# Patient Record
Sex: Male | Born: 1995 | Hispanic: No | Smoking: Never smoker
Health system: Northeastern US, Academic
[De-identification: ages and names within clinical notes are randomized; demographics above are authoritative.]

## PROBLEM LIST (undated history)

## (undated) ENCOUNTER — Encounter

## (undated) HISTORY — PX: ACHILLES TENDON REPAIR: SUR1153

---

## 2016-03-20 ENCOUNTER — Emergency Department
Admission: EM | Admit: 2016-03-20 | Discharge: 2016-03-20 | Disposition: A | Discharge status: Home / Self Care | Payer: Commercial Managed Care - PPO | Attending: Emergency Medicine | Admitting: Emergency Medicine

## 2016-03-20 ENCOUNTER — Emergency Department: Payer: Commercial Managed Care - PPO

## 2016-03-20 DIAGNOSIS — Y9371 Activity, boxing: Secondary | ICD-10-CM | POA: Diagnosis not present

## 2016-03-20 DIAGNOSIS — Y998 Other external cause status: Secondary | ICD-10-CM | POA: Diagnosis not present

## 2016-03-20 DIAGNOSIS — S0081XA Abrasion of other part of head, initial encounter: Secondary | ICD-10-CM | POA: Insufficient documentation

## 2016-03-20 DIAGNOSIS — Y929 Unspecified place or not applicable: Secondary | ICD-10-CM | POA: Insufficient documentation

## 2016-03-20 DIAGNOSIS — S022XXA Fracture of nasal bones, initial encounter for closed fracture: Secondary | ICD-10-CM | POA: Insufficient documentation

## 2016-03-20 DIAGNOSIS — S0083XA Contusion of other part of head, initial encounter: Secondary | ICD-10-CM

## 2016-03-20 DIAGNOSIS — S0990XA Unspecified injury of head, initial encounter: Secondary | ICD-10-CM | POA: Diagnosis present

## 2016-03-20 DIAGNOSIS — W51XXXA Accidental striking against or bumped into by another person, initial encounter: Secondary | ICD-10-CM | POA: Insufficient documentation

## 2016-03-20 MED ORDER — HYDROCODONE-ACETAMINOPHEN 5-325 MG PO TABS
1.0000 | ORAL_TABLET | Freq: Once | ORAL | Status: AC
Start: 2016-03-20 — End: 2016-03-20
  Administered 2016-03-20: 1 via ORAL
  Filled 2016-03-20: qty 1

## 2016-03-20 MED ORDER — HYDROCODONE-ACETAMINOPHEN 5-325 MG PO TABS: 1.0000 | ORAL_TABLET | Freq: Four times a day (QID) | ORAL | 0 refills | Status: DC | PRN

## 2016-03-20 MED ORDER — IBUPROFEN 800 MG PO TABS: 800.0000 mg | ORAL_TABLET | Freq: Once | ORAL | Status: DC

## 2016-03-20 MED ORDER — IBUPROFEN 800 MG PO TABS: 800.0000 mg | ORAL_TABLET | Freq: Three times a day (TID) | ORAL | 0 refills | Status: DC | PRN

## 2016-03-20 NOTE — ED Triage Notes (Signed)
Pt was boxing with a friend and was hit several times to face and head, nose is deviated and co feeling "foggy". Denies any LOC pt alert and appropriate.

## 2016-03-20 NOTE — ED Notes (Addendum)
Pt reports possible head injury after boxing, denies LOC.  Redness/swelling noted to nose, hematoma palpated on pt's scalp.  Pt reports some mild confusion, A&Ox4 upon assessment.  Pt reports concussion in past and current sx not as severe.  Pt reports taking 800mg  ibuprofen at home PTA

## 2016-03-20 NOTE — ED Provider Notes (Signed)
[facility] Emergency Department Provider Note   ____________________________________________   First MD Initiated Contact with Patient 03/20/16 [phone]     (approximate)  I have reviewed the triage vital signs and the nursing notes.   HISTORY  Chief Complaint Head Injury    HPI Kyle Benitez is a 21 y.o. male who presents to the ED from home with a chief complaint of facial pain. Patient was boxing with a friend, wearing thin boxing gloves, when he was struck in the head and nose. Denies loss of consciousness. Reports "fogginess" and nasal pain. Denies associated neck pain, vision changes, chest pain, shortness of breath, abdominal pain, nausea, vomiting, dizziness. Nothing makes his symptoms better. Palpation makes his symptoms worse.  Past medical history None  There are no active problems to display for this patient.   No past surgical history on file.  Prior to Admission medications   Medication Sig Start Date End Date Taking? Authorizing Provider  HYDROcodone-acetaminophen (NORCO) 5-325 MG tablet Take 1 tablet by mouth every 6 (six) hours as needed for moderate pain. 03/20/16   Irean Hong, MD  ibuprofen (ADVIL,MOTRIN) 800 MG tablet Take 1 tablet (800 mg total) by mouth every 8 (eight) hours as needed for moderate pain. 03/20/16   Irean Hong, MD    Allergies Patient has no known allergies.  No family history on file.  Social History Social History  Substance Use Topics  . Smoking status: Not on file  . Smokeless tobacco: Not on file  . Alcohol use Not on file    Review of Systems  Constitutional: No fever/chills. Eyes: No visual changes. ENT: Positive for head pain and nose pain. No sore throat. Cardiovascular: Denies chest pain. Respiratory: Denies shortness of breath. Gastrointestinal: No abdominal pain.  No nausea, no vomiting.  No diarrhea.  No constipation. Genitourinary: Negative for dysuria. Musculoskeletal: Negative for  back pain. Skin: Negative for rash. Neurological: Negative for headaches, focal weakness or numbness.  10-point ROS otherwise negative.  ____________________________________________   PHYSICAL EXAM:  VITAL SIGNS: ED Triage Vitals  Enc Vitals Group     BP 03/20/16 0209 (!) 143/64     Pulse Rate 03/20/16 0208 (!) 115     Resp 03/20/16 0208 18     Temp 03/20/16 0208 99.1 F (37.3 C)     Temp Source 03/20/16 0208 Oral     SpO2 03/20/16 0208 100 %     Weight 03/20/16 0208 145 lb (65.8 kg)     Height 03/20/16 0208 5\' 10"  (1.778 m)     Head Circumference --      Peak Flow --      Pain Score 03/20/16 0209 6     Pain Loc --      Pain Edu? --      Excl. in GC? --     Constitutional: Alert and oriented. Well appearing and in no acute distress. Eyes: Conjunctivae are normal. PERRL. EOMI. Head: Small abrasion to vertex of head. Nose: Dried blood in the left nares. Mild swelling, ecchymosis and deformity.. Mouth/Throat: Mucous membranes are moist.  Oropharynx non-erythematous. Neck: No stridor.  No cervical spine tenderness to palpation. Cardiovascular: Normal rate, regular rhythm. Grossly normal heart sounds.  Good peripheral circulation. Respiratory: Normal respiratory effort.  No retractions. Lungs CTAB. Gastrointestinal: Soft and nontender. No distention. No abdominal bruits. No CVA tenderness. Musculoskeletal: No lower extremity tenderness nor edema.  No joint effusions. Neurologic:  Alert and oriented 4. Ambulating with steady  gait. Normal speech and language. No gross focal neurologic deficits are appreciated. No gait instability. Skin:  Skin is warm, dry and intact. No rash noted. Psychiatric: Mood and affect are normal. Speech and behavior are normal.  ____________________________________________   LABS (all labs ordered are listed, but only abnormal results are displayed)  Labs Reviewed - No data to  display ____________________________________________  EKG  None ____________________________________________  RADIOLOGY  CT head and maxillofacial without contrast interpreted per Dr. Karie KirksBloomer:  CT HEAD: Normal.    CT MAXILLOFACIAL: Acute mildly displaced bilateral nasal bone  fractures.    ____________________________________________   PROCEDURES  Procedure(s) performed: None  Procedures  Critical Care performed: No  ____________________________________________   INITIAL IMPRESSION / ASSESSMENT AND PLAN / ED COURSE  Pertinent labs & imaging results that were available during my care of the patient were reviewed by me and considered in my medical decision making (see chart for details).  21 year old male with nondisplaced nasal bone fracture status post boxing with a friend. No evidence of intracranial hemorrhage on CT head. Will place on analgesics as needed and follow-up with ENT as needed. Strict return precautions given. Patient verbalizes understanding and agrees with plan of care.      ____________________________________________   FINAL CLINICAL IMPRESSION(S) / ED DIAGNOSES  Final diagnoses:  Contusion of face, initial encounter  Closed fracture of nasal bone, initial encounter  Minor head injury, initial encounter      NEW MEDICATIONS STARTED DURING THIS VISIT:  Discharge Medication List as of 03/20/2016  4:54 AM    START taking these medications   Details  HYDROcodone-acetaminophen (NORCO) 5-325 MG tablet Take 1 tablet by mouth every 6 (six) hours as needed for moderate pain., Starting Fri 03/20/2016, Print         Note:  This document was prepared using Dragon voice recognition software and may include unintentional dictation errors.    Irean HongJade J Bentlee Benningfield, MD 03/20/16 [phone]

## 2016-03-20 NOTE — Discharge Instructions (Signed)
1. Apply ice to affected area several times daily over the next 3 days. 2. You may take pain medicines as needed (Motrin/Norco #15). 3. Return to the ER for worsening symptoms, persistent vomiting, difficulty breathing, lethargy or other concerns.

## 2019-04-09 ENCOUNTER — Ambulatory Visit: Payer: Commercial Managed Care - PPO | Attending: Internal Medicine

## 2019-04-09 DIAGNOSIS — Z23 Encounter for immunization: Secondary | ICD-10-CM

## 2019-04-09 NOTE — Progress Notes (Signed)
   Covid-19 Vaccination Clinic  Name:  Kyle Benitez    MRN: [id number] DOB: 1995-08-01  04/09/2019  [name] was observed post Covid-19 immunization for 15 minutes without incident. He was provided with Vaccine Information Sheet and instruction to access the V-Safe system.   [name] was instructed to call 911 with any severe reactions post vaccine: Marland Kitchen Difficulty breathing  . Swelling of face and throat  . A fast heartbeat  . A bad rash all over body  . Dizziness and weakness   Immunizations Administered    Name Date Dose VIS Date Route   Pfizer COVID-19 Vaccine 04/09/2019  5:23 AM 0.3 mL 12/30/2018 Intramuscular   Manufacturer: [employer], [employer]   Lot: [id number]   NDC: [address]-0

## 2019-04-20 ENCOUNTER — Emergency Department
Admission: EM | Admit: 2019-04-20 | Discharge: 2019-04-20 | Disposition: A | Discharge status: Home / Self Care | Payer: Commercial Managed Care - PPO | Attending: Emergency Medicine | Admitting: Emergency Medicine

## 2019-04-20 ENCOUNTER — Encounter: Payer: Self-pay | Admitting: Emergency Medicine

## 2019-04-20 ENCOUNTER — Emergency Department: Payer: Commercial Managed Care - PPO

## 2019-04-20 ENCOUNTER — Other Ambulatory Visit: Payer: Self-pay

## 2019-04-20 DIAGNOSIS — F15988 Other stimulant use, unspecified with other stimulant-induced disorder: Secondary | ICD-10-CM | POA: Insufficient documentation

## 2019-04-20 DIAGNOSIS — W1839XA Other fall on same level, initial encounter: Secondary | ICD-10-CM | POA: Insufficient documentation

## 2019-04-20 DIAGNOSIS — Z8616 Personal history of COVID-19: Secondary | ICD-10-CM | POA: Diagnosis not present

## 2019-04-20 DIAGNOSIS — M79622 Pain in left upper arm: Secondary | ICD-10-CM | POA: Diagnosis not present

## 2019-04-20 DIAGNOSIS — Y9389 Activity, other specified: Secondary | ICD-10-CM | POA: Diagnosis not present

## 2019-04-20 DIAGNOSIS — M25512 Pain in left shoulder: Secondary | ICD-10-CM | POA: Diagnosis not present

## 2019-04-20 DIAGNOSIS — Y999 Unspecified external cause status: Secondary | ICD-10-CM | POA: Diagnosis not present

## 2019-04-20 DIAGNOSIS — Y92168 Other place in school dormitory as the place of occurrence of the external cause: Secondary | ICD-10-CM | POA: Insufficient documentation

## 2019-04-20 DIAGNOSIS — R569 Unspecified convulsions: Secondary | ICD-10-CM | POA: Diagnosis present

## 2019-04-20 LAB — CBC
HCT: 46.8 % (ref 39.0–52.0)
Hemoglobin: 16.1 g/dL (ref 13.0–17.0)
MCH: 32 pg (ref 26.0–34.0)
MCHC: 34.4 g/dL (ref 30.0–36.0)
MCV: 93 fL (ref 80.0–100.0)
Platelets: 398 10*3/uL (ref 150–400)
RBC: 5.03 MIL/uL (ref 4.22–5.81)
RDW: 12.1 % (ref 11.5–15.5)
WBC: 15.1 10*3/uL — ABNORMAL HIGH (ref 4.0–10.5)
nRBC: 0 % (ref 0.0–0.2)

## 2019-04-20 LAB — BASIC METABOLIC PANEL
Anion gap: 17 — ABNORMAL HIGH (ref 5–15)
BUN: 12 mg/dL (ref 6–20)
CO2: 20 mmol/L — ABNORMAL LOW (ref 22–32)
Calcium: 9.5 mg/dL (ref 8.9–10.3)
Chloride: 101 mmol/L (ref 98–111)
Creatinine, Ser: 1.12 mg/dL (ref 0.61–1.24)
GFR calc Af Amer: >60 mL/min (ref >60)
GFR calc non Af Amer: >60 mL/min (ref >60)
Glucose, Bld: 198 mg/dL — ABNORMAL HIGH (ref 70–99)
Potassium: 3.8 mmol/L (ref 3.5–5.1)
Sodium: 138 mmol/L (ref 135–145)

## 2019-04-20 MED ORDER — SODIUM CHLORIDE 0.9 % IV BOLUS
500.0000 mL | Freq: Once | INTRAVENOUS | Status: AC
  Administered 2019-04-20: 500 mL via INTRAVENOUS

## 2019-04-20 MED ORDER — KETOROLAC TROMETHAMINE 30 MG/ML IJ SOLN
30.0000 mg | Freq: Once | INTRAMUSCULAR | Status: AC
  Administered 2019-04-20: 30 mg via INTRAVENOUS
  Filled 2019-04-20: qty 1

## 2019-04-20 MED ORDER — HYDROCODONE-ACETAMINOPHEN 5-325 MG PO TABS
2.0000 | ORAL_TABLET | Freq: Once | ORAL | Status: AC
  Administered 2019-04-20: 20:00:00 2 via ORAL
  Filled 2019-04-20: qty 2

## 2019-04-20 MED ORDER — FENTANYL CITRATE (PF) 100 MCG/2ML IJ SOLN
50.0000 ug | Freq: Once | INTRAMUSCULAR | Status: AC
  Administered 2019-04-20: 19:00:00 50 ug via INTRAVENOUS
  Filled 2019-04-20: qty 2

## 2019-04-20 MED ORDER — HYDROCODONE-ACETAMINOPHEN 5-325 MG PO TABS: 1.0000 | ORAL_TABLET | Freq: Four times a day (QID) | ORAL | 0 refills | Status: DC | PRN

## 2019-04-20 MED ORDER — OXYCODONE-ACETAMINOPHEN 5-325 MG PO TABS: 1.0000 | ORAL_TABLET | ORAL | Status: DC | PRN

## 2019-04-20 NOTE — ED Notes (Signed)
Pt trx to CT.  

## 2019-04-20 NOTE — ED Notes (Signed)
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

## 2019-04-20 NOTE — Discharge Instructions (Addendum)
No driving until cleared by your doctor or neurologist.   Discontinue ADDERALL and herbal stimulants (these may have caused or contributed to your seizure today)

## 2019-04-20 NOTE — ED Notes (Signed)
ED Provider at bedside. 

## 2019-04-20 NOTE — ED Provider Notes (Signed)
[facility] Emergency Department Provider Note ____________________________________________   First MD Initiated Contact with Patient 04/20/19 1826     (approximate)  I have reviewed the triage vital signs and the nursing notes.  HISTORY  Chief Complaint Seizure  HPI Kyle Benitez is a 24 y.o. male here for evaluation after seizure  Patient reports he was studying in his dorm room.  He was walking down the hallway when he was witnessed to have a seizure by his roommate.  Reports he had a seizure once about 6 years ago but does not take any medication for it  He does take Adderall but last took it about 2 days ago which was prescribed to him.  He also took a supplement called Phenibut today which she reports is supposed to be a stimulant to help him study and he has not been sleeping much due to anxiety around school work  He denies heavy alcohol use.  Denies any biting his tongue or urinate himself.  Did not strike his head that he knows of.  He reports he feels fine except his left shoulder is very sore and painful.  This pain started immediately after the seizure     History reviewed. No pertinent past medical history.  There are no problems to display for this patient.   History reviewed. No pertinent surgical history.  Prior to Admission medications     Allergies Patient has no known allergies.  No family history on file.  Social History Social History   Tobacco Use  . Smoking status: Never Smoker  . Smokeless tobacco: Never Used  Substance Use Topics  . Alcohol use: Yes  . Drug use: Not on file    Review of Systems Constitutional: No fever/chills or recent illness.  He did however have Covid infection but reports that was a few months ago and got better Eyes: No visual changes. ENT: No sore throat. Cardiovascular: Denies chest pain. Respiratory: Denies shortness of breath. Gastrointestinal: No abdominal pain.     Genitourinary: Negative for dysuria. Musculoskeletal: Negative for back pain.  Left shoulder pain.  No pain in his right arm or legs hips back neck.  Does however report pretty significant pain over the front of his left shoulder Skin: Negative for rash. Neurological: Negative for headaches, areas of focal weakness or numbness.    ____________________________________________   PHYSICAL EXAM:  VITAL SIGNS: ED Triage Vitals  Enc Vitals Group     BP 04/20/19 1818 119/65     Pulse Rate 04/20/19 1817 80     Resp 04/20/19 1817 18     Temp 04/20/19 1817 98.6 F (37 C)     Temp Source 04/20/19 1817 Oral     SpO2 04/20/19 1818 96 %     Weight 04/20/19 1818 155 lb (70.3 kg)     Height 04/20/19 1818 5\' 10"  (1.778 m)     Head Circumference --      Peak Flow --      Pain Score --      Pain Loc --      Pain Edu? --      Excl. in GC? --     Constitutional: Alert and oriented. Well appearing and in no acute distress. Eyes: Conjunctivae are normal. Head: Atraumatic. Nose: No congestion/rhinnorhea. Mouth/Throat: Mucous membranes are moist. Neck: No stridor.  Cardiovascular: Normal rate, regular rhythm. Grossly normal heart sounds.  Good peripheral circulation. Respiratory: Normal respiratory effort.  No retractions. Lungs CTAB. Gastrointestinal: Soft and  nontender. No distention. Musculoskeletal: Moves all extremities well with exception of left shoulder which is painful.  He does have some slight edema overlying the left anterior shoulder joint.  Focal tenderness over the left shoulder and upper one third of the left humerus  Strong intact radial pulses bilateral.  Normal median radial and ulnar nerve examination left upper extremity.  Moves all other extremities well without difficulty distress or pain.  Neurologic:  Normal speech and language. No gross focal neurologic deficits are appreciated.  Reflexes are slightly brisk. Skin:  Skin is warm, dry and intact. No rash  noted. Psychiatric: Mood and affect are normal. Speech and behavior are normal.  ____________________________________________   LABS (all labs ordered are listed, but only abnormal results are displayed)  Labs Reviewed  CBC - Abnormal; Notable for the following components:      Result Value   WBC 15.1 (*)    All other components within normal limits  BASIC METABOLIC PANEL - Abnormal; Notable for the following components:   CO2 20 (*)    Glucose, Bld 198 (*)    Anion gap 17 (*)    All other components within normal limits   ____________________________________________  EKG  Reviewed inter by me at 2000 Heart rate 79 QRS 99 QTc 420 Normal sinus rhythm, slight sinus arrhythmia.  No prolongation of QT.  No abnormal T waves. ____________________________________________  RADIOLOGY  DG Ribs Unilateral W/Chest Left  Result Date: 04/20/2019 CLINICAL DATA:  Fall with rib pain EXAM: LEFT RIBS AND CHEST - 3+ VIEW COMPARISON:  None. FINDINGS: No fracture or other bone lesions are seen involving the ribs. There is no evidence of pneumothorax or pleural effusion. Both lungs are clear. Heart size and mediastinal contours are within normal limits. IMPRESSION: Negative. Electronically Signed   By: Donavan Foil M.D.   On: 04/20/2019 19:38   DG Forearm Left  Result Date: 04/20/2019 CLINICAL DATA:  Extremity pain EXAM: LEFT FOREARM - 2 VIEW COMPARISON:  None. FINDINGS: There is no evidence of fracture or other focal bone lesions. Soft tissues are unremarkable. IMPRESSION: Negative. Electronically Signed   By: Donavan Foil M.D.   On: 04/20/2019 19:33   CT Head Wo Contrast  Result Date: 04/20/2019 CLINICAL DATA:  [place] with subsequent seizure like activity. EXAM: CT HEAD WITHOUT CONTRAST TECHNIQUE: Contiguous axial images were obtained from the base of the skull through the vertex without intravenous contrast. COMPARISON:  head CT 03/20/2016 FINDINGS: Brain: The brain shows a normal appearance without  evidence of malformation, atrophy, old or acute small or large vessel infarction, mass lesion, hemorrhage, hydrocephalus or extra-axial collection. Vascular: No hyperdense vessel. No evidence of atherosclerotic calcification. Skull: Normal.  No traumatic finding.  No focal bone lesion. Sinuses/Orbits: Sinuses are clear. Orbits appear normal. Mastoids are clear. Other: None significant IMPRESSION: Normal head CT. Electronically Signed   By: Nelson Chimes M.D.   On: 04/20/2019 20:11   DG Humerus Left  Result Date: 04/20/2019 CLINICAL DATA:  [place], injury EXAM: LEFT HUMERUS - 2+ VIEW COMPARISON:  None. FINDINGS: Frontal and lateral views of the left humerus are obtained. No fractures. Alignment of the left shoulder and elbow is anatomic. Soft tissues are normal. IMPRESSION: 1. Unremarkable left humerus. Electronically Signed   By: Randa Ngo M.D.   On: 04/20/2019 18:59    Imaging studies reviewed including normal CT of the head.  Negative imaging of the left forearm humerus ribs ____________________________________________   PROCEDURES  Procedure(s) performed: None  Procedures  Critical Care performed: No  ____________________________________________   INITIAL IMPRESSION / ASSESSMENT AND PLAN / ED COURSE  Pertinent labs & imaging results that were available during my care of the patient were reviewed by me and considered in my medical decision making (see chart for details).     Clinical Course as of Apr 19 2036  Thu Apr 20, 2019  0093 He denies pain in the left hand forearm or elbow.  On examination no tenderness across the joints or bony portions of the left hand forearm or elbow, but does report moderate pain and fairly significant pain to palpation across the anterior left shoulder joint and proximal humerus without obvious deformity but some swelling   [MQ]  1951 Patient gave me permission to discuss his x-ray findings and concerns about his shoulder pain and my recommendation for  temporary sling and follow-up closely with orthopedics.  Discussed this with him and his parents at his permission, however he did not wish for me to discuss the use of Adderall or supplements that could have contributed to his seizure today with his parents that we did not.  I did discuss this with the patient privately however, and he is agreeable to discontinuing use of Adderall and stimulant supplements which could have contributed to his seizure today.  Parents report previously that he had a seizure about 6 years ago which they believe is attributable to Wellbutrin use   [MQ]  2016 Labs revealed minimally reduced CO2, also mild leukocytosis which is nonspecific.  Suspect these are likely secondary to his seizure.  He is awake alert fully oriented with normal vital signs without complaint other than left shoulder discomfort at this time.   [MQ]    Clinical Course User Index [MQ] Sharyn Creamer, MD   ----------------------------------------- 8:37 PM on 04/20/2019 -----------------------------------------  Vitals:   04/20/19 1817 04/20/19 1818  BP:  119/65  Pulse: 80 80  Resp: 18 18  Temp: 98.6 F (37 C) 98.6 F (37 C)  SpO2:  96%    Patient resting comfortably no distress.  Discussed with the patient and his parents plan of care.  I will prescribe the patient a narcotic pain medicine due to their condition which I anticipate will cause at least moderate pain short term. I discussed with the patient safe use of narcotic pain medicines, and that they are not to drive, work in dangerous areas, or ever take more than prescribed. We discussed that this is the type of medication that can be  overdosed on and the risks of this type of medicine. Patient is very agreeable to only use as prescribed and to never use more than prescribed.  He will discontinue use of Adderall and herbal stimulants.  Work to get plenty of rest.  Follow-up with neurology in as well as orthopedics.    ____________________________________________   FINAL CLINICAL IMPRESSION(S) / ED DIAGNOSES  Final diagnoses:  Substance-induced seizure (HCC)  Acute pain of left shoulder        Note:  This document was prepared using Dragon voice recognition software and may include unintentional dictation errors       Sharyn Creamer, MD 04/20/19 2038

## 2019-04-20 NOTE — ED Triage Notes (Addendum)
Brought in by friends    Friends states that he fell   Landed on left arm  Then developed sz like activity .Marland Kitchen hx of sz in past  Pt is pale and diaphoretic

## 2019-04-21 ENCOUNTER — Observation Stay: Payer: Commercial Managed Care - PPO

## 2019-04-21 ENCOUNTER — Other Ambulatory Visit: Payer: Self-pay

## 2019-04-21 ENCOUNTER — Emergency Department: Payer: Commercial Managed Care - PPO

## 2019-04-21 ENCOUNTER — Observation Stay
Admission: EM | Admit: 2019-04-21 | Discharge: 2019-04-22 | Disposition: A | Discharge status: Home / Self Care | Payer: Commercial Managed Care - PPO | Attending: Internal Medicine | Admitting: Internal Medicine

## 2019-04-21 DIAGNOSIS — M25512 Pain in left shoulder: Secondary | ICD-10-CM | POA: Insufficient documentation

## 2019-04-21 DIAGNOSIS — R569 Unspecified convulsions: Principal | ICD-10-CM

## 2019-04-21 DIAGNOSIS — D72829 Elevated white blood cell count, unspecified: Secondary | ICD-10-CM | POA: Diagnosis not present

## 2019-04-21 DIAGNOSIS — Z7901 Long term (current) use of anticoagulants: Secondary | ICD-10-CM | POA: Insufficient documentation

## 2019-04-21 DIAGNOSIS — F1390 Sedative, hypnotic, or anxiolytic use, unspecified, uncomplicated: Secondary | ICD-10-CM | POA: Diagnosis not present

## 2019-04-21 DIAGNOSIS — J341 Cyst and mucocele of nose and nasal sinus: Secondary | ICD-10-CM | POA: Insufficient documentation

## 2019-04-21 DIAGNOSIS — M25519 Pain in unspecified shoulder: Secondary | ICD-10-CM | POA: Diagnosis present

## 2019-04-21 DIAGNOSIS — G40909 Epilepsy, unspecified, not intractable, without status epilepticus: Secondary | ICD-10-CM

## 2019-04-21 DIAGNOSIS — R030 Elevated blood-pressure reading, without diagnosis of hypertension: Secondary | ICD-10-CM | POA: Diagnosis present

## 2019-04-21 DIAGNOSIS — Z20822 Contact with and (suspected) exposure to covid-19: Secondary | ICD-10-CM | POA: Insufficient documentation

## 2019-04-21 HISTORY — DX: Epilepsy, unspecified, not intractable, without status epilepticus: G40.909

## 2019-04-21 LAB — CBC
HCT: 45.7 % (ref 39.0–52.0)
Hemoglobin: 16.2 g/dL (ref 13.0–17.0)
MCH: 32.6 pg (ref 26.0–34.0)
MCHC: 35.4 g/dL (ref 30.0–36.0)
MCV: 92 fL (ref 80.0–100.0)
Platelets: 315 10*3/uL (ref 150–400)
RBC: 4.97 MIL/uL (ref 4.22–5.81)
RDW: 12.3 % (ref 11.5–15.5)
WBC: 21.2 10*3/uL — ABNORMAL HIGH (ref 4.0–10.5)
nRBC: 0 % (ref 0.0–0.2)

## 2019-04-21 LAB — URINALYSIS, COMPLETE (UACMP) WITH MICROSCOPIC
Bacteria, UA: NONE SEEN
Bilirubin Urine: NEGATIVE
Glucose, UA: NEGATIVE mg/dL
Ketones, ur: NEGATIVE mg/dL
Leukocytes,Ua: NEGATIVE
Nitrite: NEGATIVE
Protein, ur: NEGATIVE mg/dL
Specific Gravity, Urine: 1.005 (ref 1.005–1.030)
pH: 5 (ref 5.0–8.0)

## 2019-04-21 LAB — COMPREHENSIVE METABOLIC PANEL
ALT: 31 U/L (ref 0–44)
AST: 51 U/L — ABNORMAL HIGH (ref 15–41)
Albumin: 4.6 g/dL (ref 3.5–5.0)
Alkaline Phosphatase: 96 U/L (ref 38–126)
Anion gap: 11 (ref 5–15)
BUN: 10 mg/dL (ref 6–20)
CO2: 23 mmol/L (ref 22–32)
Calcium: 8.9 mg/dL (ref 8.9–10.3)
Chloride: 105 mmol/L (ref 98–111)
Creatinine, Ser: 1.12 mg/dL (ref 0.61–1.24)
GFR calc Af Amer: >60 mL/min (ref >60)
GFR calc non Af Amer: >60 mL/min (ref >60)
Glucose, Bld: 115 mg/dL — ABNORMAL HIGH (ref 70–99)
Potassium: 3.9 mmol/L (ref 3.5–5.1)
Sodium: 139 mmol/L (ref 135–145)
Total Bilirubin: 1.1 mg/dL (ref 0.3–1.2)
Total Protein: 7.6 g/dL (ref 6.5–8.1)

## 2019-04-21 LAB — URINE DRUG SCREEN, QUALITATIVE (ARMC ONLY)
Amphetamines, Ur Screen: NOT DETECTED
Barbiturates, Ur Screen: NOT DETECTED
Benzodiazepine, Ur Scrn: NOT DETECTED
Cannabinoid 50 Ng, Ur ~~LOC~~: POSITIVE — AB
Cocaine Metabolite,Ur ~~LOC~~: NOT DETECTED
MDMA (Ecstasy)Ur Screen: NOT DETECTED
Methadone Scn, Ur: NOT DETECTED
Opiate, Ur Screen: POSITIVE — AB
Phencyclidine (PCP) Ur S: NOT DETECTED
Tricyclic, Ur Screen: NOT DETECTED

## 2019-04-21 LAB — SARS CORONAVIRUS 2 (TAT 6-24 HRS): SARS Coronavirus 2: NEGATIVE

## 2019-04-21 LAB — HIV ANTIBODY (ROUTINE TESTING W REFLEX): HIV Screen 4th Generation wRfx: NONREACTIVE

## 2019-04-21 MED ORDER — LORAZEPAM 2 MG/ML IJ SOLN
1.0000 mg | Freq: Once | INTRAMUSCULAR | Status: AC
  Administered 2019-04-21: 1 mg via INTRAVENOUS
  Filled 2019-04-21: qty 1

## 2019-04-21 MED ORDER — IBUPROFEN 400 MG PO TABS: 600.0000 mg | ORAL_TABLET | Freq: Four times a day (QID) | ORAL | Status: DC | PRN

## 2019-04-21 MED ORDER — KETOROLAC TROMETHAMINE 30 MG/ML IJ SOLN
30.0000 mg | Freq: Once | INTRAMUSCULAR | Status: AC
  Administered 2019-04-21: 04:00:00 30 mg via INTRAVENOUS
  Filled 2019-04-21: qty 1

## 2019-04-21 MED ORDER — SODIUM CHLORIDE 0.9 % IV SOLN
75.0000 mL/h | INTRAVENOUS | Status: DC
  Administered 2019-04-21: 07:00:00 75 mL/h via INTRAVENOUS

## 2019-04-21 MED ORDER — ENOXAPARIN SODIUM 40 MG/0.4ML ~~LOC~~ SOLN
40.0000 mg | SUBCUTANEOUS | Status: DC
  Administered 2019-04-21: 40 mg via SUBCUTANEOUS
  Filled 2019-04-21: qty 0.4

## 2019-04-21 MED ORDER — SENNOSIDES-DOCUSATE SODIUM 8.6-50 MG PO TABS: 1.0000 | ORAL_TABLET | Freq: Every evening | ORAL | Status: DC | PRN

## 2019-04-21 MED ORDER — DIAZEPAM 5 MG/ML IJ SOLN
5.0000 mg | Freq: Once | INTRAMUSCULAR | Status: AC
  Administered 2019-04-21: 06:00:00 5 mg via INTRAVENOUS
  Filled 2019-04-21: qty 2

## 2019-04-21 MED ORDER — KETOROLAC TROMETHAMINE 30 MG/ML IJ SOLN
30.0000 mg | Freq: Four times a day (QID) | INTRAMUSCULAR | Status: AC | PRN
  Administered 2019-04-21 (×2): 30 mg via INTRAVENOUS
  Filled 2019-04-21 (×2): qty 1

## 2019-04-21 MED ORDER — ACETAMINOPHEN 650 MG RE SUPP: 650.0000 mg | RECTAL | Status: DC | PRN

## 2019-04-21 MED ORDER — LORAZEPAM 2 MG/ML IJ SOLN: 1.0000 mg | INTRAMUSCULAR | Status: DC | PRN

## 2019-04-21 MED ORDER — ACETAMINOPHEN 325 MG PO TABS: 650.0000 mg | ORAL_TABLET | ORAL | Status: DC | PRN

## 2019-04-21 MED ORDER — GADOBUTROL 1 MMOL/ML IV SOLN
7.0000 mL | Freq: Once | INTRAVENOUS | Status: AC | PRN
  Administered 2019-04-21: 7 mL via INTRAVENOUS
  Filled 2019-04-21: qty 7.5

## 2019-04-21 NOTE — Consult Note (Signed)
Reason for Consult:Seizures Requesting Physician: Para March  CC: Seizures  I have been asked by Dr. Para March to see this patient in consultation for seizures.  HPI: Kyle Benitez is an 24 y.o. 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   Patient was released from the ED and returned hours later with a recurrent generalized seizure.  Patient reports taking Xanax fairly regularly for the past 3-4 weeks.  Stopped taking for the past few days.  Also reports Adderall use and a herbal stimulant that he is using as well.      Past Medical History:  Diagnosis Date  . 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 ACHILLES TENDON REPAIR      No family history on file.  Social History:  reports that he has never smoked. He has never used smokeless tobacco. He reports current alcohol use. He reports current drug use. Drug: Marijuana.  Allergies  Allergen Reactions  . Bupropion     Other reaction(s): Seizures  . Latex Rash    Medications:  I have reviewed the patient's current medications. Prior to Admission: (Not in a hospital admission)  Scheduled: . enoxaparin (LOVENOX) injection  40 mg Subcutaneous Q24H    ROS: History obtained from the patient  General ROS: negative for - chills, fatigue, fever, night sweats, weight gain or weight loss Psychological ROS: anxiety Ophthalmic ROS: negative for - blurry vision, double vision, eye pain or loss of vision ENT ROS: negative for - epistaxis, nasal discharge, oral lesions, sore throat, tinnitus or vertigo Allergy and Immunology ROS: negative for - hives or itchy/watery eyes Hematological and Lymphatic ROS: negative for - bleeding problems, bruising or swollen lymph nodes Endocrine ROS: negative for - galactorrhea, hair pattern changes, polydipsia/polyuria or temperature intolerance Respiratory ROS:  negative for - cough, hemoptysis, shortness of breath or wheezing Cardiovascular ROS: negative for - chest pain, dyspnea on exertion, edema or irregular heartbeat Gastrointestinal ROS: negative for - abdominal pain, diarrhea, hematemesis, nausea/vomiting or stool incontinence Genito-Urinary ROS: negative for - dysuria, hematuria, incontinence or urinary frequency/urgency Musculoskeletal ROS: negative for - joint swelling or muscular weakness Neurological ROS: as noted in HPI, headache Dermatological ROS: negative for rash and skin lesion changes  Physical Examination: Blood pressure (!) 154/92, pulse 65, temperature 97.7 F (36.5 C), temperature source Oral, resp. rate (!) 22, height 5\' 10"  (1.778 m), weight 70.3 kg, SpO2 97 %.  HEENT-  Normocephalic, no lesions, without obvious abnormality.  Normal external eye and conjunctiva.  Normal TM's bilaterally.  Normal auditory canals and external ears. Normal external nose, mucus membranes and septum.  Normal pharynx. Cardiovascular- S1, S2 normal, pulses palpable throughout   Lungs- chest clear, no wheezing, rales, normal symmetric air entry Abdomen- soft, non-tender; bowel sounds normal; no masses,  no organomegaly Extremities- no edema Lymph-no adenopathy palpable Musculoskeletal-no joint tenderness, deformity or swelling Skin-warm and dry, no hyperpigmentation, vitiligo, or suspicious lesions  Neurological Examination   Mental Status: Alert, oriented, thought content appropriate.  Speech fluent without evidence of aphasia.  Able to follow 3 step commands without difficulty. Cranial Nerves: II: Discs flat bilaterally; Visual fields grossly normal, pupils equal, round, reactive to light and accommodation III,IV, VI: ptosis not present, extra-ocular motions intact bilaterally V,VII: smile symmetric, facial light touch sensation normal bilaterally VIII: hearing normal bilaterally IX,X: gag reflex present XI: bilateral shoulder shrug XII:  midline  tongue extension Motor: Right : Upper extremity   5/5    Left:     Upper extremity   5/5  Lower extremity   5/5     Lower extremity   5/5 Tone and bulk:normal tone throughout; no atrophy noted Sensory: Pinprick and light touch intact throughout, bilaterally Deep Tendon Reflexes: Symmetric throughout Plantars: Right: downgoing   Left: downgoing Cerebellar: Normal finger-to-nose and normal heel-to-shin testing bilaterally Gait: not tested due to safety concerns   Laboratory Studies:   Basic Metabolic Panel: Recent Labs  Lab 04/20/19 1834 04/21/19 0427  NA 138 139  K 3.8 3.9  CL 101 105  CO2 20* 23  GLUCOSE 198* 115*  BUN 12 10  CREATININE 1.12 1.12  CALCIUM 9.5 8.9    Liver Function Tests: Recent Labs  Lab 04/21/19 0427  AST 51*  ALT 31  ALKPHOS 96  BILITOT 1.1  PROT 7.6  ALBUMIN 4.6   No results for input(s): LIPASE, AMYLASE in the last 168 hours. No results for input(s): AMMONIA in the last 168 hours.  CBC: Recent Labs  Lab 04/20/19 1834 04/21/19 0427  WBC 15.1* 21.2*  HGB 16.1 16.2  HCT 46.8 45.7  MCV 93.0 92.0  PLT 398 315    Cardiac Enzymes: No results for input(s): CKTOTAL, CKMB, CKMBINDEX, TROPONINI in the last 168 hours.  BNP: Invalid input(s): POCBNP  CBG: No results for input(s): GLUCAP in the last 168 hours.  Microbiology: No results found for this or any previous visit.  Coagulation Studies: No results for input(s): LABPROT, INR in the last 72 hours.  Urinalysis:  Recent Labs  Lab 04/21/19 0722  COLORURINE STRAW*  LABSPEC 1.005  PHURINE 5.0  GLUCOSEU NEGATIVE  HGBUR SMALL*  BILIRUBINUR NEGATIVE  KETONESUR NEGATIVE  PROTEINUR NEGATIVE  NITRITE NEGATIVE  LEUKOCYTESUR NEGATIVE    Lipid Panel:  No results found for: CHOL, TRIG, HDL, CHOLHDL, VLDL, LDLCALC  HgbA1C: No results found for: HGBA1C  Urine Drug Screen:      Component Value Date/Time   LABOPIA POSITIVE (A) 04/21/2019 0722   COCAINSCRNUR NONE DETECTED  04/21/2019 0722   LABBENZ NONE DETECTED 04/21/2019 0722   AMPHETMU NONE DETECTED 04/21/2019 0722   THCU POSITIVE (A) 04/21/2019 0722   LABBARB NONE DETECTED 04/21/2019 0722    Alcohol Level: No results for input(s): ETH in the last 168 hours.  Other results: EKG: normal sinus rhythm at 83 bpm.  Imaging: DG Ribs Unilateral W/Chest Left  Result Date: 04/20/2019 CLINICAL DATA:  Fall with rib pain EXAM: LEFT RIBS AND CHEST - 3+ VIEW COMPARISON:  None. FINDINGS: No fracture or other bone lesions are seen involving the ribs. There is no evidence of pneumothorax or pleural effusion. Both lungs are clear. Heart size and mediastinal contours are within normal limits. IMPRESSION: Negative. Electronically Signed   By: Jasmine Pang M.D.   On: 04/20/2019 19:38   DG Forearm Left  Result Date: 04/20/2019 CLINICAL DATA:  Extremity pain EXAM: LEFT FOREARM - 2 VIEW COMPARISON:  None. FINDINGS: There is no evidence of fracture or other focal bone lesions. Soft tissues are unremarkable. IMPRESSION: Negative. Electronically Signed   By: Jasmine Pang M.D.   On: 04/20/2019 19:33   CT Head Wo Contrast  Result Date: 04/20/2019 CLINICAL DATA:  Larey Seat with subsequent seizure like activity. EXAM: CT HEAD WITHOUT CONTRAST TECHNIQUE: Contiguous axial images were obtained from the base of the skull through the vertex without intravenous contrast. COMPARISON:  head CT 03/20/2016 FINDINGS: Brain: The brain  shows a normal appearance without evidence of malformation, atrophy, old or acute small or large vessel infarction, mass lesion, hemorrhage, hydrocephalus or extra-axial collection. Vascular: No hyperdense vessel. No evidence of atherosclerotic calcification. Skull: Normal.  No traumatic finding.  No focal bone lesion. Sinuses/Orbits: Sinuses are clear. Orbits appear normal. Mastoids are clear. Other: None significant IMPRESSION: Normal head CT. Electronically Signed   By: Nelson Chimes M.D.   On: 04/20/2019 20:11   DG  Shoulder Left  Result Date: 04/21/2019 CLINICAL DATA:  Left shoulder pain EXAM: LEFT SHOULDER - 2+ VIEW COMPARISON:  None. FINDINGS: There is no evidence of fracture or dislocation. There is no evidence of arthropathy or other focal bone abnormality. Soft tissues are unremarkable. IMPRESSION: Negative. Electronically Signed   By: Monte Fantasia M.D.   On: 04/21/2019 04:34   DG Humerus Left  Result Date: 04/20/2019 CLINICAL DATA:  [place], injury EXAM: LEFT HUMERUS - 2+ VIEW COMPARISON:  None. FINDINGS: Frontal and lateral views of the left humerus are obtained. No fractures. Alignment of the left shoulder and elbow is anatomic. Soft tissues are normal. IMPRESSION: 1. Unremarkable left humerus. Electronically Signed   By: Randa Ngo M.D.   On: 04/20/2019 18:59     Assessment/Plan: 24 year old male presenting with recurrent seizures.  Seizures likely provoked.  Patient admits to recent benzodiazepine discontinuation.  UDS positive for opiates and THC as well.  Has been taking an herbal stimulant. Head CT personally reviewed and shows no acute changes.  Due to recurrent nature of seizures though will do further testing before determining that anticonvulsant therapy is not indicated.  Recommendations: 1. EEG 2. MRI of the brain with and without contrast 3. If above work up unremarkable, no anticonvulsant therapy is indicated.  Patient to refrain from use of all stimulants, opiates and benzodiazepines. 4. Seizure precautions 5. Patient unable to drive, operate heavy machinery, perform activities at heights and participate in water activities until release by outpatient physician.  Alexis Goodell, MD Neurology [phone] 04/21/2019, 11:31 AM

## 2019-04-21 NOTE — ED Notes (Addendum)
ED TO INPATIENT HANDOFF REPORT  ED Nurse Name and Phone #: [phone]  S Name/Age/Gender Kyle Benitez 24 y.o. male Room/Bed: ED31A/ED31A  Code Status   Code Status: Full Code  Home/SNF/Other Home Patient oriented to: self, place, time and situation Is this baseline? Yes   Triage Complete: Triage complete  Chief Complaint Seizure [facility]) [R56.9]  Triage Note PT to ED from home c/o having a seizure. PT states friends found him on the ground, pt states no recolection. PT was seen earlier today for the same, and told that he possibly tore his rotator cuff, states he would "like something" for that as well. PT alert and keenly responsive.     Allergies Allergies  Allergen Reactions  . Bupropion     Other reaction(s): Seizures  . Latex Rash    Level of Care/Admitting Diagnosis ED Disposition    ED Disposition Condition [facility] Area: [place] [100120]  Level of Care: Telemetry [5]  Covid Evaluation: Asymptomatic Screening Protocol (No Symptoms)  Diagnosis: Seizure [facility]) [[id number]]  Admitting Physician: Athena Masse [[id number]]  Attending Physician: Athena Masse [[id number]]       B Medical/Surgery History Past Medical History:  Diagnosis Date  . Recurrent seizures ([place]) 04/21/2019   Past Surgical History:  Procedure Laterality Date  . ACHILLES TENDON REPAIR       A IV Location/Drains/Wounds Patient Lines/Drains/Airways Status   Active Line/Drains/Airways    Name:   Placement date:   Placement time:   Site:   Days:   Peripheral IV 04/21/19 Right Antecubital   04/21/19    0719    Antecubital   less than 1          Intake/Output Last 24 hours  Intake/Output Summary (Last 24 hours) at 04/21/2019 1355 Last data filed at 04/21/2019 0902 Gross per 24 hour  Intake 126.21 ml  Output --  Net 126.21 ml    Labs/Imaging Results for orders placed or performed during the hospital encounter of 04/21/19 (from the past 48  hour(s))  CBC     Status: Abnormal   Collection Time: 04/21/19  4:27 AM  Result Value Ref Range   WBC 21.2 (H) 4.0 - 10.5 K/uL   RBC 4.97 4.22 - 5.81 MIL/uL   Hemoglobin 16.2 13.0 - 17.0 g/dL   HCT 45.7 39.0 - 52.0 %   MCV 92.0 80.0 - 100.0 fL   MCH 32.6 26.0 - 34.0 pg   MCHC 35.4 30.0 - 36.0 g/dL   RDW 12.3 11.5 - 15.5 %   Platelets 315 150 - 400 K/uL   nRBC 0.0 0.0 - 0.2 %    Comment: Performed at [facility], [address] [place], [place] [address]  Comprehensive metabolic panel     Status: Abnormal   Collection Time: 04/21/19  4:27 AM  Result Value Ref Range   Sodium 139 135 - 145 mmol/L   Potassium 3.9 3.5 - 5.1 mmol/L   Chloride 105 98 - 111 mmol/L   CO2 23 22 - 32 mmol/L   Glucose, Bld 115 (H) 70 - 99 mg/dL    Comment: Glucose reference range applies only to samples taken after fasting for at least 8 hours.   BUN 10 6 - 20 mg/dL   Creatinine, Ser 1.12 0.61 - 1.24 mg/dL   Calcium 8.9 8.9 - 10.3 mg/dL   Total Protein 7.6 6.5 - 8.1 g/dL   Albumin 4.6 3.5 - 5.0 g/dL  AST 51 (H) 15 - 41 U/L   ALT 31 0 - 44 U/L   Alkaline Phosphatase 96 38 - 126 U/L   Total Bilirubin 1.1 0.3 - 1.2 mg/dL   GFR calc non Af Amer >60 >60 mL/min   GFR calc Af Amer >60 >60 mL/min   Anion gap 11 5 - 15    Comment: Performed at [facility], [address] [place], [place] [address]  SARS CORONAVIRUS 2 (TAT 6-24 HRS) Nasopharyngeal Nasopharyngeal Swab     Status: None   Collection Time: 04/21/19  6:46 AM   Specimen: Nasopharyngeal Swab  Result Value Ref Range   SARS Coronavirus 2 NEGATIVE NEGATIVE    Comment: (NOTE) SARS-CoV-2 target nucleic acids are NOT DETECTED. The SARS-CoV-2 RNA is generally detectable in upper and lower respiratory specimens during the acute phase of infection. Negative results do not preclude SARS-CoV-2 infection, do not rule out co-infections with other pathogens, and should not be used as the sole basis for treatment or other patient  management decisions. Negative results must be combined with clinical observations, patient history, and epidemiological information. The expected result is Negative. Fact Sheet for Patients: HairSlick.no Fact Sheet for Healthcare Providers: quierodirigir.com This test is not yet approved or cleared by the [place] FDA and  has been authorized for detection and/or diagnosis of SARS-CoV-2 by FDA under an Emergency Use Authorization (EUA). This EUA will remain  in effect (meaning this test can be used) for the duration of the COVID-19 declaration under Section 56 4(b)(1) of the Act, 21 U.S.C. section 360bbb-3(b)(1), unless the authorization is terminated or revoked sooner. Performed at [facility], [address], [place], [place] [address]   Urine Drug Screen, Qualitative ([facility] only)     Status: Abnormal   Collection Time: 04/21/19  7:22 AM  Result Value Ref Range   Tricyclic, Ur Screen NONE DETECTED NONE DETECTED   Amphetamines, Ur Screen NONE DETECTED NONE DETECTED   MDMA (Ecstasy)Ur Screen NONE DETECTED NONE DETECTED   Cocaine Metabolite,Ur [place] NONE DETECTED NONE DETECTED   Opiate, Ur Screen POSITIVE (A) NONE DETECTED   Phencyclidine (PCP) Ur S NONE DETECTED NONE DETECTED   Cannabinoid 50 Ng, Ur [place] POSITIVE (A) NONE DETECTED   Barbiturates, Ur Screen NONE DETECTED NONE DETECTED   Benzodiazepine, Ur Scrn NONE DETECTED NONE DETECTED   Methadone Scn, Ur NONE DETECTED NONE DETECTED    Comment: (NOTE) Tricyclics + metabolites, urine    Cutoff 1000 ng/mL Amphetamines + metabolites, urine  Cutoff 1000 ng/mL MDMA (Ecstasy), urine              Cutoff 500 ng/mL Cocaine Metabolite, urine          Cutoff 300 ng/mL Opiate + metabolites, urine        Cutoff 300 ng/mL Phencyclidine (PCP), urine         Cutoff 25 ng/mL Cannabinoid, urine                 Cutoff 50 ng/mL Barbiturates + metabolites, urine  Cutoff 200  ng/mL Benzodiazepine, urine              Cutoff 200 ng/mL Methadone, urine                   Cutoff 300 ng/mL The urine drug screen provides only a preliminary, unconfirmed analytical test result and should not be used for non-medical purposes. Clinical consideration and professional judgment should be applied to any positive drug  screen result due to possible interfering substances. A more specific alternate chemical method must be used in order to obtain a confirmed analytical result. Gas chromatography / mass spectrometry (GC/MS) is the preferred confirmat ory method. Performed at [facility], [address] [place], [place] [address]   HIV Antibody (routine testing w rflx)     Status: None   Collection Time: 04/21/19  7:22 AM  Result Value Ref Range   HIV Screen 4th Generation wRfx NON REACTIVE NON REACTIVE    Comment: Performed at [facility], [address] [place], [place] [address]  Urinalysis, Complete w Microscopic     Status: Abnormal   Collection Time: 04/21/19  7:22 AM  Result Value Ref Range   Color, Urine STRAW (A) YELLOW   APPearance CLEAR (A) CLEAR   Specific Gravity, Urine 1.005 1.005 - 1.030   pH 5.0 5.0 - 8.0   Glucose, UA NEGATIVE NEGATIVE mg/dL   Hgb urine dipstick SMALL (A) NEGATIVE   Bilirubin Urine NEGATIVE NEGATIVE   Ketones, ur NEGATIVE NEGATIVE mg/dL   Protein, ur NEGATIVE NEGATIVE mg/dL   Nitrite NEGATIVE NEGATIVE   Leukocytes,Ua NEGATIVE NEGATIVE   RBC / HPF 0-5 0 - 5 RBC/hpf   WBC, UA 0-5 0 - 5 WBC/hpf   Bacteria, UA NONE SEEN NONE SEEN   Squamous Epithelial / LPF 0-5 0 - 5   Mucus PRESENT     Comment: Performed at [facility], [address] [place], [place] [address]   DG Ribs Unilateral W/Chest Left  Result Date: 04/20/2019 CLINICAL DATA:  Fall with rib pain EXAM: LEFT RIBS AND CHEST - 3+ VIEW COMPARISON:  None. FINDINGS: No fracture or other bone lesions are seen involving the ribs. There is no evidence of  pneumothorax or pleural effusion. Both lungs are clear. Heart size and mediastinal contours are within normal limits. IMPRESSION: Negative. Electronically Signed   By: Jasmine Pang M.D.   On: 04/20/2019 19:38   DG Forearm Left  Result Date: 04/20/2019 CLINICAL DATA:  Extremity pain EXAM: LEFT FOREARM - 2 VIEW COMPARISON:  None. FINDINGS: There is no evidence of fracture or other focal bone lesions. Soft tissues are unremarkable. IMPRESSION: Negative. Electronically Signed   By: Jasmine Pang M.D.   On: 04/20/2019 19:33   CT Head Wo Contrast  Result Date: 04/20/2019 CLINICAL DATA:  Larey Seat with subsequent seizure like activity. EXAM: CT HEAD WITHOUT CONTRAST TECHNIQUE: Contiguous axial images were obtained from the base of the skull through the vertex without intravenous contrast. COMPARISON:  head CT 03/20/2016 FINDINGS: Brain: The brain shows a normal appearance without evidence of malformation, atrophy, old or acute small or large vessel infarction, mass lesion, hemorrhage, hydrocephalus or extra-axial collection. Vascular: No hyperdense vessel. No evidence of atherosclerotic calcification. Skull: Normal.  No traumatic finding.  No focal bone lesion. Sinuses/Orbits: Sinuses are clear. Orbits appear normal. Mastoids are clear. Other: None significant IMPRESSION: Normal head CT. Electronically Signed   By: Paulina Fusi M.D.   On: 04/20/2019 20:11   DG Shoulder Left  Result Date: 04/21/2019 CLINICAL DATA:  Left shoulder pain EXAM: LEFT SHOULDER - 2+ VIEW COMPARISON:  None. FINDINGS: There is no evidence of fracture or dislocation. There is no evidence of arthropathy or other focal bone abnormality. Soft tissues are unremarkable. IMPRESSION: Negative. Electronically Signed   By: Marnee Spring M.D.   On: 04/21/2019 04:34   DG Humerus Left  Result Date: 04/20/2019 CLINICAL DATA:  Larey Seat, injury EXAM: LEFT  HUMERUS - 2+ VIEW COMPARISON:  None. FINDINGS: Frontal and lateral views of the left humerus are obtained.  No fractures. Alignment of the left shoulder and elbow is anatomic. Soft tissues are normal. IMPRESSION: 1. Unremarkable left humerus. Electronically Signed   By: Sharlet [place]ina M.D.   On: 04/20/2019 18:59    Pending Labs Unresulted Labs (From admission, onward)   None      Vitals/Pain Today's Vitals   04/21/19 0730 04/21/19 0800 04/21/19 0901 04/21/19 0920  BP: 128/76 (!) 154/92 (!) 154/92   Pulse: 69 70 65   Resp: 14 15 (!) 22   Temp:      TempSrc:      SpO2: 98% 98% 97%   Weight:      Height:      PainSc:    6     Isolation Precautions No active isolations  Medications Medications  0.9 %  sodium chloride infusion (75 mL/hr Intravenous Rate/Dose Verify 04/21/19 0902)  enoxaparin (LOVENOX) injection 40 mg (40 mg Subcutaneous Given 04/21/19 0915)  LORazepam (ATIVAN) injection 1-2 mg (has no administration in time range)  acetaminophen (TYLENOL) tablet 650 mg (has no administration in time range)    Or  acetaminophen (TYLENOL) suppository 650 mg (has no administration in time range)  senna-docusate (Senokot-S) tablet 1 tablet (has no administration in time range)  ibuprofen (ADVIL) tablet 600 mg (has no administration in time range)  gadobutrol (GADAVIST) 1 MMOL/ML injection 7 mL (has no administration in time range)  ketorolac (TORADOL) 30 MG/ML injection 30 mg (30 mg Intravenous Given 04/21/19 0424)  diazepam (VALIUM) injection 5 mg (5 mg Intravenous Given 04/21/19 0538)    Mobility walks High fall risk   Focused Assessments    R Recommendations: See Admitting Provider Note  Report given to: Morrie Sheldon, RN

## 2019-04-21 NOTE — ED Provider Notes (Signed)
[facility] Emergency Department Provider Note  ____________________________________________   First MD Initiated Contact with Patient 04/21/19 0345     (approximate)  I have reviewed the triage vital signs and the nursing notes.   HISTORY  Chief Complaint Seizures    HPI Kyle Benitez is a 24 y.o. male with below list of previous medical conditions including recent ED visit yesterday secondary to 6 witnessed seizure returns to the emergency department secondary to a another witnessed seizure tonight.  Patient's last seizure before yesterday was 6 years ago and was presumed to be secondary to Wellbutrin.  Patient also informed Dr. Fanny Bien yesterday that he has been taking Adderall which he states was prescribed to him as well as phenibut.  After conversation with the patient he admitted that his seizure 6 years ago may have been secondary to Xanax withdrawal and admits that he has been taking Xanax which was not prescribed to him.  Patient states that his last dose of Xanax was on Wednesday.  Patient also admits to smoking marijuana.'s seizures that was witnessed by the patient's roommate, he has no recollection of the event stating that he was in bed and woke up on the floor       No past medical history on file.  There are no problems to display for this patient.   Past Surgical History:  Procedure Laterality Date  . ACHILLES TENDON REPAIR      Prior to Admission medications   Medication Sig Start Date End Date Taking? Authorizing Provider  HYDROcodone-acetaminophen (NORCO) 5-325 MG tablet Take 1 tablet by mouth every 6 (six) hours as needed for moderate pain. 03/20/16   Irean Hong, MD  HYDROcodone-acetaminophen (NORCO/VICODIN) 5-325 MG tablet Take 1-2 tablets by mouth every 6 (six) hours as needed for moderate pain or severe pain. 04/20/19   Sharyn Creamer, MD  ibuprofen (ADVIL,MOTRIN) 800 MG tablet Take 1 tablet (800 mg total) by mouth every 8 (eight)  hours as needed for moderate pain. 03/20/16   Irean Hong, MD    Allergies Patient has no known allergies.  No family history on file.  Social History Social History   Tobacco Use  . Smoking status: Never Smoker  . Smokeless tobacco: Never Used  Substance Use Topics  . Alcohol use: Yes  . Drug use: Yes    Types: Marijuana    Comment: occasional recreational xanax     Review of Systems Constitutional: No fever/chills Eyes: No visual changes. ENT: No sore throat. Cardiovascular: Denies chest pain. Respiratory: Denies shortness of breath. Gastrointestinal: No abdominal pain.  No nausea, no vomiting.  No diarrhea.  No constipation. Genitourinary: Negative for dysuria. Musculoskeletal: Negative for neck pain.  Negative for back pain. Integumentary: Negative for rash. Neurological: Positive for witnessed seizure-like activity ____________________________________________   PHYSICAL EXAM:  VITAL SIGNS: ED Triage Vitals  Enc Vitals Group     BP 04/21/19 0112 137/79     Pulse Rate 04/21/19 0111 61     Resp 04/21/19 0117 20     Temp 04/21/19 0111 97.7 F (36.5 C)     Temp Source 04/21/19 0111 Oral     SpO2 04/21/19 0112 94 %     Weight 04/21/19 0111 70.3 kg (155 lb)     Height 04/21/19 0111 1.778 m (5\' 10" )     Head Circumference --      Peak Flow --      Pain Score 04/21/19 0111 9     Pain  Loc --      Pain Edu? --      Excl. in [place]? --     Constitutional: Alert and oriented.  Eyes: Conjunctivae are normal.  Head: Atraumatic. Mouth/Throat: Patient is wearing a mask. Neck: No stridor.  No meningeal signs.   Cardiovascular: Normal rate, regular rhythm. Good peripheral circulation. Grossly normal heart sounds. Respiratory: Normal respiratory effort.  No retractions. Gastrointestinal: Soft and nontender. No distention.   Musculoskeletal: No lower extremity tenderness nor edema. No gross deformities of extremities. Neurologic:  Normal speech and language. No gross  focal neurologic deficits are appreciated.  Skin:  Skin is warm, dry and intact. Psychiatric: Mood and affect are normal. Speech and behavior are normal.  ____________________________________________   LABS (all labs ordered are listed, but only abnormal results are displayed)  Labs Reviewed  URINE DRUG SCREEN, QUALITATIVE ([place])  CBC  COMPREHENSIVE METABOLIC PANEL   ____________________________________________  EKG  ED ECG REPORT I, [place], the attending physician, personally viewed and interpreted this ECG.   Date: 04/21/2019  EKG Time: 1:17 AM  Rate: 83  Rhythm: Normal sinus rhythm  Axis: Normal  Intervals: Normal  ST&T Change: None  ____________________________________________  RADIOLOGY I, [place] Carreen Milius, personally viewed and evaluated these images (plain radiographs) as part of my medical decision making, as well as reviewing the written report by the radiologist.    Official radiology report(s): DG Ribs Unilateral W/Chest Left  Result Date: 04/20/2019 CLINICAL DATA:  Fall with rib pain EXAM: LEFT RIBS AND CHEST - 3+ VIEW COMPARISON:  None. FINDINGS: No fracture or other bone lesions are seen involving the ribs. There is no evidence of pneumothorax or pleural effusion. Both lungs are clear. Heart size and mediastinal contours are within normal limits. IMPRESSION: Negative. Electronically Signed   By: Donavan Foil M.D.   On: 04/20/2019 19:38   DG Forearm Left  Result Date: 04/20/2019 CLINICAL DATA:  Extremity pain EXAM: LEFT FOREARM - 2 VIEW COMPARISON:  None. FINDINGS: There is no evidence of fracture or other focal bone lesions. Soft tissues are unremarkable. IMPRESSION: Negative. Electronically Signed   By: Donavan Foil M.D.   On: 04/20/2019 19:33   CT Head Wo Contrast  Result Date: 04/20/2019 CLINICAL DATA:  [place] with subsequent seizure like activity. EXAM: CT HEAD WITHOUT CONTRAST TECHNIQUE: Contiguous axial images were obtained from the base  of the skull through the vertex without intravenous contrast. COMPARISON:  head CT 03/20/2016 FINDINGS: Brain: The brain shows a normal appearance without evidence of malformation, atrophy, old or acute small or large vessel infarction, mass lesion, hemorrhage, hydrocephalus or extra-axial collection. Vascular: No hyperdense vessel. No evidence of atherosclerotic calcification. Skull: Normal.  No traumatic finding.  No focal bone lesion. Sinuses/Orbits: Sinuses are clear. Orbits appear normal. Mastoids are clear. Other: None significant IMPRESSION: Normal head CT. Electronically Signed   By: Nelson Chimes M.D.   On: 04/20/2019 20:11   DG Humerus Left  Result Date: 04/20/2019 CLINICAL DATA:  [place], injury EXAM: LEFT HUMERUS - 2+ VIEW COMPARISON:  None. FINDINGS: Frontal and lateral views of the left humerus are obtained. No fractures. Alignment of the left shoulder and elbow is anatomic. Soft tissues are normal. IMPRESSION: 1. Unremarkable left humerus. Electronically Signed   By: Randa Ngo M.D.   On: 04/20/2019 18:59    ____________________________________________   PROCEDURES    .Critical Care Performed by: Gregor Hams, MD Authorized by: Gregor Hams, MD   Critical care provider statement:  Critical care time (minutes):  30   Critical care time was exclusive of:  Separately billable procedures and treating other patients   Critical care was necessary to treat or prevent imminent or life-threatening deterioration of the following conditions:  CNS failure or compromise   Critical care was time spent personally by me on the following activities:  Development of treatment plan with patient or surrogate, discussions with consultants, evaluation of patient's response to treatment, examination of patient, obtaining history from patient or surrogate, ordering and performing treatments and interventions, ordering and review of laboratory studies, ordering and review of radiographic  studies, pulse oximetry, re-evaluation of patient's condition and review of old charts     ____________________________________________   INITIAL IMPRESSION / MDM / ASSESSMENT AND PLAN / ED COURSE  As part of my medical decision making, I reviewed the following data within the electronic MEDICAL RECORD NUMBER   24 year old male presented with above-stated history and physical exam with differential diagnosis including but not limited to benzodiazepine withdrawal seizure versus substance-induced seizure.  Given the history provided by the patient that his seizure 6 years ago and he suspects that his seizure today is secondary to benzodiazepine withdrawal the patient will be given Valium 5 mg IV.  Patient did not give me permission to speak to his parents.  I encouraged the patient to speak to his parents honestly regarding his use of illicit drugs.  Patient will be admitted to the hospital for further evaluation and management.  Patient discussed with Dr. Para March  ____________________________________________  FINAL CLINICAL IMPRESSION(S) / ED DIAGNOSES  Final diagnoses:  Shoulder pain  Seizure (HCC)     MEDICATIONS GIVEN DURING THIS VISIT:  Medications  ketorolac (TORADOL) 30 MG/ML injection 30 mg (has no administration in time range)     ED Discharge Orders    None      *Please note:  Taegan Standage was evaluated in Emergency Department on 04/21/2019 for the symptoms described in the history of present illness. He was evaluated in the context of the global COVID-19 pandemic, which necessitated consideration that the patient might be at risk for infection with the SARS-CoV-2 virus that causes COVID-19. Institutional protocols and algorithms that pertain to the evaluation of patients at risk for COVID-19 are in a state of rapid change based on information released by regulatory bodies including the CDC and federal and state organizations. These policies and algorithms were followed  during the patient's care in the ED.  Some ED evaluations and interventions may be delayed as a result of limited staffing during the pandemic.*  Note:  This document was prepared using Dragon voice recognition software and may include unintentional dictation errors.   Darci Current, MD 04/21/19 [phone]

## 2019-04-21 NOTE — ED Triage Notes (Signed)
PT to ED from home c/o having a seizure. PT states friends found him on the ground, pt states no recolection. PT was seen earlier today for the same, and told that he possibly tore his rotator cuff, states he would "like something" for that as well. PT alert and keenly responsive.

## 2019-04-21 NOTE — Progress Notes (Signed)
He complains of pain in his left shoulder from falling after the seizure.  Otherwise, he feels okay.  Blood pressure slightly elevated probably because of pain.  Analgesics have been prescribed.  MRI of the brain and EEG has been ordered for further evaluation.  Follow-up with neurologist for further recommendations.

## 2019-04-21 NOTE — ED Notes (Signed)
Admitting MD messaged regarding diet orders for pt.  Pt assisted to bathroom, UA collected, sent to lab. Pt assisted back to bed, padded side rails in place. NAD noted, pt had no issues getting up to the bathroom, ambulatory with a steady gait.

## 2019-04-21 NOTE — H&P (Signed)
History and Physical    [name] HDQ:[id number] DOB: June 23, 1995 DOA: 04/21/2019  PCP: System, Pcp Not In   Patient coming from: home I have personally briefly reviewed patient's old medical records in [facility]  Chief Complaint: seizure  HPI: Kyle Benitez is a 24 y.o. male with medical history significant for seizures 6 years ago who presents to the emergency room for the second time in 24 hours with a witnessed seizure.  Patient was treated earlier in the day and seizure was believed related to a herbal stimulant taken earlier.  He returned later with another witnessed seizure.  He subsequently admitted that he had been using unprescribed Xanax. ED Course: In the ER he was awake and alert with vitals within normal limits. blood work showed [place]ocytosis [place] 119[address], up from 15,000 earlier.  He had an x-ray done earlier for anterior shoulder pain related to the seizure and fall showed no bony injury.  CT head was also unremarkable.  Hospitalist consulted for obs admission  Review of Systems: As per HPI otherwise 10 point review of systems negative.    Past Medical History:  Diagnosis Date  . 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 ACHILLES TENDON REPAIR       reports that he has never smoked. He has never used smokeless tobacco. He reports current alcohol use. He reports current drug use. Drug: Marijuana.  No Known Allergies  No family history on file.   Prior to Admission medications   Medication Sig Start Date End Date Taking? Authorizing Provider  HYDROcodone-acetaminophen (NORCO) 5-325 MG tablet Take 1 tablet by mouth every 6 (six) hours as needed for moderate pain. 03/20/16   Irean Hong, MD  HYDROcodone-acetaminophen (NORCO/VICODIN) 5-325 MG tablet Take 1-2 tablets by mouth every 6 (six) hours as needed for moderate pain or severe pain. 04/20/19   Sharyn Creamer, MD  ibuprofen (ADVIL,MOTRIN) 800 MG tablet Take  1 tablet (800 mg total) by mouth every 8 (eight) hours as needed for moderate pain. 03/20/16   Irean Hong, MD    Physical Exam: Vitals:   04/21/19 0353 04/21/19 0354 04/21/19 0355 04/21/19 0356  BP:      Pulse: 83 86 88 88  Resp:      Temp:      TempSrc:      SpO2: 100% 100% 100% 100%  Weight:      Height:         Vitals:   04/21/19 0353 04/21/19 0354 04/21/19 0355 04/21/19 0356  BP:      Pulse: 83 86 88 88  Resp:      Temp:      TempSrc:      SpO2: 100% 100% 100% 100%  Weight:      Height:        Constitutional: Somnolent but easily arousable, oriented x3, not in any acute distress. Eyes: PERLA, EOMI, irises appear normal, anicteric sclera,  ENMT: external ears and nose appear normal, normal hearing             Lips appears normal, oropharynx mucosa, tongue, posterior pharynx appear normal  Neck: neck appears normal, no masses, normal ROM, no thyromegaly, no JVD  CVS: S1-S2 clear, no murmur rubs or gallops,  , no carotid bruits, pedal pulses palpable, No LE edema Respiratory:  clear to auscultation bilaterally, no wheezing, rales or rhonchi. Respiratory effort normal. No accessory muscle use.  Abdomen: soft nontender, nondistended, normal  bowel sounds, no hepatosplenomegaly, no hernias Musculoskeletal: : no cyanosis, clubbing , no contractures or atrophy Neuro: Cranial nerves II-XII intact, sensation, reflexes normal, strength Psych: judgement and insight appear normal, stable mood and affect,  Skin: no rashes or lesions or ulcers, no induration or nodules   Labs on Admission: I have personally reviewed following labs and imaging studies  CBC: Recent Labs  Lab 04/20/19 1834 04/21/19 0427  WBC 15.1* 21.2*  HGB 16.1 16.2  HCT 46.8 45.7  MCV 93.0 92.0  PLT 398 811   Basic Metabolic Panel: Recent Labs  Lab 04/20/19 1834 04/21/19 0427  NA 138 139  K 3.8 3.9  CL 101 105  CO2 20* 23  GLUCOSE 198* 115*  BUN 12 10  CREATININE 1.12 1.12  CALCIUM 9.5 8.9    GFR: Estimated Creatinine Clearance: 102.9 mL/min (by C-G formula based on SCr of 1.12 mg/dL). Liver Function Tests: Recent Labs  Lab 04/21/19 0427  AST 51*  ALT 31  ALKPHOS 96  BILITOT 1.1  PROT 7.6  ALBUMIN 4.6   No results for input(s): LIPASE, AMYLASE in the last 168 hours. No results for input(s): AMMONIA in the last 168 hours. Coagulation Profile: No results for input(s): INR, PROTIME in the last 168 hours. Cardiac Enzymes: No results for input(s): CKTOTAL, CKMB, CKMBINDEX, TROPONINI in the last 168 hours. BNP (last 3 results) No results for input(s): PROBNP in the last 8760 hours. HbA1C: No results for input(s): HGBA1C in the last 72 hours. CBG: No results for input(s): GLUCAP in the last 168 hours. Lipid Profile: No results for input(s): CHOL, HDL, LDLCALC, TRIG, CHOLHDL, LDLDIRECT in the last 72 hours. Thyroid Function Tests: No results for input(s): TSH, T4TOTAL, FREET4, T3FREE, THYROIDAB in the last 72 hours. Anemia Panel: No results for input(s): VITAMINB12, FOLATE, FERRITIN, TIBC, IRON, RETICCTPCT in the last 72 hours. Urine analysis: No results found for: COLORURINE, APPEARANCEUR, LABSPEC, [place], GLUCOSEU, HGBUR, BILIRUBINUR, KETONESUR, PROTEINUR, UROBILINOGEN, NITRITE, LEUKOCYTESUR  Radiological Exams on Admission: DG Ribs Unilateral W/Chest Left  Result Date: 04/20/2019 CLINICAL DATA:  Fall with rib pain EXAM: LEFT RIBS AND CHEST - 3+ VIEW COMPARISON:  None. FINDINGS: No fracture or other bone lesions are seen involving the ribs. There is no evidence of pneumothorax or pleural effusion. Both lungs are clear. Heart size and mediastinal contours are within normal limits. IMPRESSION: Negative. Electronically Signed   By: Donavan Foil M.D.   On: 04/20/2019 19:38   DG Forearm Left  Result Date: 04/20/2019 CLINICAL DATA:  Extremity pain EXAM: LEFT FOREARM - 2 VIEW COMPARISON:  None. FINDINGS: There is no evidence of fracture or other focal bone lesions. Soft  tissues are unremarkable. IMPRESSION: Negative. Electronically Signed   By: Donavan Foil M.D.   On: 04/20/2019 19:33   CT Head Wo Contrast  Result Date: 04/20/2019 CLINICAL DATA:  [place] with subsequent seizure like activity. EXAM: CT HEAD WITHOUT CONTRAST TECHNIQUE: Contiguous axial images were obtained from the base of the skull through the vertex without intravenous contrast. COMPARISON:  head CT 03/20/2016 FINDINGS: Brain: The brain shows a normal appearance without evidence of malformation, atrophy, old or acute small or large vessel infarction, mass lesion, hemorrhage, hydrocephalus or extra-axial collection. Vascular: No hyperdense vessel. No evidence of atherosclerotic calcification. Skull: Normal.  No traumatic finding.  No focal bone lesion. Sinuses/Orbits: Sinuses are clear. Orbits appear normal. Mastoids are clear. Other: None significant IMPRESSION: Normal head CT. Electronically Signed   By: Nelson Chimes M.D.   On: 04/20/2019  20:11   DG Shoulder Left  Result Date: 04/21/2019 CLINICAL DATA:  Left shoulder pain EXAM: LEFT SHOULDER - 2+ VIEW COMPARISON:  None. FINDINGS: There is no evidence of fracture or dislocation. There is no evidence of arthropathy or other focal bone abnormality. Soft tissues are unremarkable. IMPRESSION: Negative. Electronically Signed   By: Marnee Spring M.D.   On: 04/21/2019 04:34   DG Humerus Left  Result Date: 04/20/2019 CLINICAL DATA:  [place], injury EXAM: LEFT HUMERUS - 2+ VIEW COMPARISON:  None. FINDINGS: Frontal and lateral views of the left humerus are obtained. No fractures. Alignment of the left shoulder and elbow is anatomic. Soft tissues are normal. IMPRESSION: 1. Unremarkable left humerus. Electronically Signed   By: Sharlet Salina M.D.   On: 04/20/2019 18:59    EKG: Independently reviewed.   Assessment/Plan Active Problems:   Seizure (HCC)   Benzodiazepine misuse -Suspect benzodiazepine withdrawal seizure .  -Admits to using unprescribed  Xanax -Remote history of a seizure 6 years prior related to Wellbutrin use -Admit to observation -Neurologic checks, seizure precautions -Ativan as needed seizure.  Keppra load not given. -EEG in the a.m. -Neurology consult -Substance abuse counseling -Follow urine drug screen  Leukocytosis -WBC was 21,000 up from 15,000 -Suspect related to seizure x2 today as no other stigmata of infection --No fever.  Otherwise asymptomatic -Chest x-ray was clear earlier.  Follow urinalysis -Continue to monitor    - DVT prophylaxis: Lovenox  Code Status: full code  Family Communication:  none  Disposition Plan: Back to previous home environment Consults called: neurology Status:obs   Andris Baumann MD [facility]     04/21/2019, 5:02 AM

## 2019-04-21 NOTE — ED Notes (Signed)
Per Manson Passey, MD; no need for repeat blood work since it was just done earlier this evening

## 2019-04-21 NOTE — ED Notes (Signed)
This RN and Aundra Millet, RN bedside. Pt resting comfortably, no complaints or needs at this time.   Pt requested not to update family on details of hospitalization if they call, pt requested phone calls be referred to him.

## 2019-04-21 NOTE — Progress Notes (Signed)
eeg completed ° °

## 2019-04-22 DIAGNOSIS — R030 Elevated blood-pressure reading, without diagnosis of hypertension: Secondary | ICD-10-CM

## 2019-04-22 DIAGNOSIS — R569 Unspecified convulsions: Secondary | ICD-10-CM | POA: Diagnosis not present

## 2019-04-22 DIAGNOSIS — F1390 Sedative, hypnotic, or anxiolytic use, unspecified, uncomplicated: Secondary | ICD-10-CM | POA: Diagnosis not present

## 2019-04-22 LAB — CBC WITH DIFFERENTIAL/PLATELET
Abs Immature Granulocytes: 0.09 10*3/uL — ABNORMAL HIGH (ref 0.00–0.07)
Basophils Absolute: 0 10*3/uL (ref 0.0–0.1)
Basophils Relative: 0 %
Eosinophils Absolute: 0.1 10*3/uL (ref 0.0–0.5)
Eosinophils Relative: 0 %
HCT: 46.3 % (ref 39.0–52.0)
Hemoglobin: 16 g/dL (ref 13.0–17.0)
Immature Granulocytes: 1 %
Lymphocytes Relative: 19 %
Lymphs Abs: 2.5 10*3/uL (ref 0.7–4.0)
MCH: 32 pg (ref 26.0–34.0)
MCHC: 34.6 g/dL (ref 30.0–36.0)
MCV: 92.6 fL (ref 80.0–100.0)
Monocytes Absolute: 1.8 10*3/uL — ABNORMAL HIGH (ref 0.1–1.0)
Monocytes Relative: 14 %
Neutro Abs: 8.4 10*3/uL — ABNORMAL HIGH (ref 1.7–7.7)
Neutrophils Relative %: 66 %
Platelets: 296 10*3/uL (ref 150–400)
RBC: 5 MIL/uL (ref 4.22–5.81)
RDW: 12.2 % (ref 11.5–15.5)
WBC: 12.9 10*3/uL — ABNORMAL HIGH (ref 4.0–10.5)
nRBC: 0 % (ref 0.0–0.2)

## 2019-04-22 MED ORDER — ACETAMINOPHEN 325 MG PO TABS: 650.0000 mg | ORAL_TABLET | ORAL | Status: AC | PRN

## 2019-04-22 MED ORDER — IBUPROFEN 600 MG PO TABS: 600.0000 mg | ORAL_TABLET | Freq: Three times a day (TID) | ORAL | Status: AC | PRN

## 2019-04-22 NOTE — Progress Notes (Signed)
Pt d/c to home via parents. IV removed intact. VSS. Education completed. Belongings sent with pt. All questions answered.

## 2019-04-22 NOTE — Procedures (Signed)
ELECTROENCEPHALOGRAM REPORT   Patient: Kyle Benitez       Room #: 117A-AA EEG No. ID: [id number] Age: 25 y.o.        Sex: male Requesting Physician: Myriam Forehand Report Date:  04/22/2019        Interpreting Physician: Thana Farr  History: Kyle Benitez is an 24 y.o. male with seizures  Medications:  Lovenox  Conditions of Recording:  This is a 21 channel routine scalp EEG performed with bipolar and monopolar montages arranged in accordance to the international 10/20 system of electrode placement. One channel was dedicated to EKG recording.  The patient is in the awake, drowsy and asleep states.  Description:  The waking background activity consists of a low voltage, symmetrical, fairly well organized, 10 Hz alpha activity, seen from the parieto-occipital and posterior temporal regions.  Low voltage fast activity, poorly organized, is seen anteriorly and is at times superimposed on more posterior regions.  A mixture of theta and alpha rhythms are seen from the central and temporal regions. The patient drowses with slowing to irregular, low voltage theta and beta activity.   The patient goes in to a light sleep with symmetrical sleep spindles, vertex central sharp transients and irregular slow activity.   Hyperventilation was not performed.  Intermittent photic stimulation was performed but failed to illicit any change in the tracing.    IMPRESSION: Normal electroencephalogram, awake, asleep and with activation procedures.    Thana Farr, MD Neurology [phone] 04/22/2019, 8:32 AM

## 2019-04-22 NOTE — Discharge Instructions (Signed)
Non-Epileptic Seizures, Adult A seizure can cause:  Involuntary movements, like falling or shaking.  Changes in awareness or consciousness.  Convulsions. These are episodes of uncontrollable, jerking movement caused by sudden, intense tightening (contraction) of the muscles. Epileptic seizures are caused by abnormal electrical activity in the brain. Non-epileptic seizures are different. They are not caused by abnormal electrical signals in your brain. These seizures may look like epileptic seizures, but they are not caused by epilepsy. There are two types of non-epileptic seizures:  Physiologic non-epileptic seizure. This type results from an underlying problem that causes a disruption in your brain's electrical activity.  Psychogenic non-epileptic seizure. This type results from emotional stress. These seizures are sometimes called pseudoseizures. What are the causes? Causes of physiologic non-epileptic seizures can include:  Sudden drop in blood pressure.  Low blood sugar (glucose).  Low levels of salt (sodium) in your blood.  Low levels of calcium in your blood.  Migraine.  Heart rhythm problems.  Sleep disorders, such as narcolepsy.  Movement disorders, such as Tourette syndrome.  Infection.  Certain medicines.  Drug and alcohol abuse.  Fever. Common causes of psychogenic non-epileptic seizures can include:  Stress.  Emotional trauma.  Sexual or physical abuse.  Major life events, such as divorce or death of a loved one.  Mental health disorders, including anxiety and depression. What are the signs or symptoms? Symptoms of a non-epileptic seizure can be similar to those of an epileptic seizure, which may include:  A change in attention or behavior (altered mental status).  Loss of consciousness or fainting.  Convulsions with rhythmic jerking movements.  Drooling.  Rapid eye movements.  Grunting.  Loss of bladder control and bowel  control.  Bitter taste in the mouth.  Tongue biting. Some people experience unusual sensations (aura) before having a seizure. These can include:  "Butterflies" in the stomach.  Abnormal smells or tastes.  A feeling of having had a new experience before (dj vu). After a non-epileptic seizure, you may have a headache or sore muscles or feel confused and sleepy. Non-epileptic seizures usually:  Do not cause physical injuries.  Start slowly.  Include crying or shrieking.  Last longer than 2 minutes.  Include pelvic thrusting. How is this diagnosed? Non-epileptic seizures may be diagnosed by:  Your medical history.  A physical exam.  Your symptoms. ? Your health care provider may want to talk with your friends or relatives who have seen you have a seizure. ? If possible, it is helpful if you write down your seizure activity, including what led up to the seizure, and share that information with your health care provider. You may also need to have tests to look for causes of physiologic non-epileptic seizures. These may include:  An electroencephalogram (EEG). This test measures electrical activity in your brain. If you have had a non-epileptic seizure, the results of your EEG will likely be normal.  Video EEG. This test takes place in the hospital over the course of 2-7 days. The test uses a video camera and an EEG to monitor your symptoms and the electrical activity in your brain.  Blood tests.  Lumbar puncture. This test involves pulling fluid from your spine to check for infection.  Electrocardiogram (ECG or EKG). This test checks for an abnormal heart rhythm.  CT scan. If your health care provider thinks you have had a psychogenic non-epileptic seizure, you may need to see a mental health specialist for an evaluation. How is this treated? The treatment for your   seizures will depend on what is causing them. When the underlying condition is treated, your seizures should  stop. If your seizures are being caused by emotional trauma or stress, your health care provider may recommend that you see a mental health professional. Treatment may include:  Relaxation therapy or cognitive behavioral therapy.  Medicines to treat depression or anxiety.  Individual or family counseling. In some cases, you may have psychogenic seizures in addition to epileptic seizures. If this is the case, you may be prescribed medicine to help with the epileptic seizures. Follow these instructions at home: Home care will depend on the type of non-epileptic seizures that you have. In general:  Follow all instructions from your health care provider. These may include ways to prevent seizures and what to do if you have a seizure.  Take over-the-counter and prescription medicines only as told by your health care provider.  Keep all follow-up visits as told by your health care provider. This is important.  Make sure family members, friends, and coworkers are trained on how to help you if you have a seizure. If you have a seizure, they should: ? Lay you on the ground to prevent a fall. ? Place a pillow or piece of clothing under your head. ? Loosen any clothing around your neck. ? Turn you onto your side. If vomiting occurs, this helps keep your airway clear.  Avoid any substances that may prevent your medicine from working properly. If you are prescribed medicine for seizures: ? Do not use recreational drugs. ? Limit or avoid alcoholic beverages. Contact a health care provider if:  Your seizures change or become more frequent.  You continue to have seizures after treatment. Get help right away if:  You injure yourself during a seizure.  You have one seizure after another.  You have trouble recovering from a seizure.  You have chest pain or trouble breathing.  You have a seizure that lasts longer than 5 minutes. Summary  Non-epileptic seizures may look like epileptic  seizures, but they are not caused by epilepsy.  The treatment for your seizures will depend on what is causing them. When the underlying condition is treated, your seizures should stop.  Make sure family members, friends, and coworkers are trained on how to help you if you have a seizure. If you have a seizure, they should lay you on the ground to prevent a fall, protect your head and neck, and turn you onto your side. This information is not intended to replace advice given to you by your health care provider. Make sure you discuss any questions you have with your health care provider. Document Revised: 12/18/2016 Document Reviewed: 10/18/2015 Elsevier Patient Education  2020 [employer]  

## 2019-04-22 NOTE — Discharge Summary (Signed)
Physician Discharge Summary  Kyle Benitez JME:[id number] DOB: Sep 20, 1995 DOA: 04/21/2019  PCP: System, Pcp Not In  Admit date: 04/21/2019 Discharge date: 04/22/2019  Discharge disposition: Home   Recommendations for Outpatient Follow-Up:   Follow-up with PCP   Discharge Diagnosis:   Principal Problem:   Seizure [facility]) Active Problems:   Benzodiazepine misuse   Elevated blood pressure reading    Discharge Condition: Stable.  Diet recommendation: Heart healthy diet  Code status: Full code    Hospital Course:   Mr. Kyle Benitez is a 24 y.o. male with medical history significant for seizures 6 years ago who presents to the emergency room for the second time in 24 hours with a witnessed seizure.  Patient was seen in the ED earlier that day, and seizure was thought to be related to to a herbal stimulant patient said he had taken earlier.  He returned later with another witnessed seizure.  He subsequently admitted that he had been using unprescribed Xanax.  He was admitted to the hospital for observation.  Seizure was thought to be due to benzodiazepine withdrawal.  Evaluated by the neurologist.  MRI of the brain and EEG were unremarkable.  Antiepileptic was recommended.  He also complained of severe left shoulder pain from a fall following the seizure.  X-ray was unremarkable.  He was treated with analgesics and was advised to follow-up with PCP to ensure resolution of left shoulder pain.  Blood pressure was elevated.  He said he is not a known hypertensive.  He was not started on any antihypertensives and he was advised to follow-up with PCP to make to monitor his blood pressure since persistently elevated blood pressure may require treatment. He feels better and is deemed medically stable for discharge to home.  Discharge plan was discussed with the patient and his parents at the bedside.  He has been advised to avoid any benzodiazepines      Discharge Exam:    Vitals:   04/21/19 1508 04/21/19 2336  BP: (!) 150/85 (!) 158/86  Pulse: 88   Resp: 18   Temp: 98.5 F (36.9 C) 98.6 F (37 C)  SpO2: 99%    Vitals:   04/21/19 0901 04/21/19 1409 04/21/19 1508 04/21/19 2336  BP: (!) 154/92 (!) 153/83 (!) 150/85 (!) 158/86  Pulse: 65 70 88   Resp: (!) 22 18 18    Temp:   98.5 F (36.9 C) 98.6 F (37 C)  TempSrc:   Oral Oral  SpO2: 97% 97% 99%   Weight:      Height:         GEN: NAD SKIN: No rash EYES: EOMI ENT: MMM CV: RRR PULM: CTA B ABD: soft, ND, NT, +BS CNS: AAO x 3, non focal EXT: No edema or tenderness MSK: No swelling, tenderness or erythema noted on the left shoulder.  He is able to move left shoulder although there is slight decreased range of motion (abduction and extension) because of pain  The results of significant diagnostics from this hospitalization (including imaging, microbiology, ancillary and laboratory) are listed below for reference.     Procedures and Diagnostic Studies:   EEG  Result Date: 04/22/2019 06/22/2019, MD     04/22/2019  8:34 AM ELECTROENCEPHALOGRAM REPORT Patient: Kyle Benitez       Room #: 117A-AA EEG No. ID: [id number] Age: 24 y.o.        Sex: male Requesting Physician: 22 Report Date:  04/22/2019       Interpreting Physician:  Kyle Benitez History: Kyle Benitez is an 24 y.o. male with seizures Medications: Lovenox Conditions of Recording:  This is a 21 channel routine scalp EEG performed with bipolar and monopolar montages arranged in accordance to the international 10/20 system of electrode placement. One channel was dedicated to EKG recording. The patient is in the awake, drowsy and asleep states. Description:  The waking background activity consists of a low voltage, symmetrical, fairly well organized, 10 Hz alpha activity, seen from the parieto-occipital and posterior temporal regions.  Low voltage fast activity, poorly organized, is seen anteriorly and is at times superimposed on  more posterior regions.  A mixture of theta and alpha rhythms are seen from the central and temporal regions. The patient drowses with slowing to irregular, low voltage theta and beta activity.  The patient goes in to a light sleep with symmetrical sleep spindles, vertex central sharp transients and irregular slow activity.  Hyperventilation was not performed.  Intermittent photic stimulation was performed but failed to illicit any change in the tracing.  IMPRESSION: Normal electroencephalogram, awake, asleep and with activation procedures. Kyle Goodell, MD Neurology [phone] 04/22/2019, 8:32 AM   MR BRAIN W WO CONTRAST  Result Date: 04/21/2019 CLINICAL DATA:  Seizure EXAM: MRI HEAD WITHOUT AND WITH CONTRAST TECHNIQUE: Multiplanar, multiecho pulse sequences of the brain and surrounding structures were obtained without and with intravenous contrast. CONTRAST:  56mL GADAVIST GADOBUTROL 1 MMOL/ML IV SOLN COMPARISON:  None. FINDINGS: Motion artifact is present. Brain: There is no acute infarction or intracranial hemorrhage. There is no intracranial mass, mass effect, or edema. There is no hydrocephalus or extra-axial fluid collection. Hippocampi are symmetric and demonstrate normal size and signal. No abnormal enhancement. Vascular: Major vessel flow voids at the skull base are preserved. Skull and upper cervical spine: Normal marrow signal is preserved. Sinuses/Orbits: Small left maxillary sinus retention cyst. Orbits are unremarkable. Other: Sella is unremarkable.  Mastoid air cells are clear. IMPRESSION: No structural abnormality identified. Electronically Signed   By: Macy Mis M.D.   On: 04/21/2019 17:56   DG Shoulder Left  Result Date: 04/21/2019 CLINICAL DATA:  Left shoulder pain EXAM: LEFT SHOULDER - 2+ VIEW COMPARISON:  None. FINDINGS: There is no evidence of fracture or dislocation. There is no evidence of arthropathy or other focal bone abnormality. Soft tissues are unremarkable. IMPRESSION:  Negative. Electronically Signed   By: Monte Fantasia M.D.   On: 04/21/2019 04:34     Labs:   Basic Metabolic Panel: Recent Labs  Lab 04/20/19 1834 04/21/19 0427  NA 138 139  K 3.8 3.9  CL 101 105  CO2 20* 23  GLUCOSE 198* 115*  BUN 12 10  CREATININE 1.12 1.12  CALCIUM 9.5 8.9   GFR Estimated Creatinine Clearance: 102.9 mL/min (by C-G formula based on SCr of 1.12 mg/dL). Liver Function Tests: Recent Labs  Lab 04/21/19 0427  AST 51*  ALT 31  ALKPHOS 96  BILITOT 1.1  PROT 7.6  ALBUMIN 4.6   No results for input(s): LIPASE, AMYLASE in the last 168 hours. No results for input(s): AMMONIA in the last 168 hours. Coagulation profile No results for input(s): INR, PROTIME in the last 168 hours.  CBC: Recent Labs  Lab 04/20/19 1834 04/21/19 0427 04/22/19 0544  WBC 15.1* 21.2* 12.9*  NEUTROABS  --   --  8.4*  HGB 16.1 16.2 16.0  HCT 46.8 45.7 46.3  MCV 93.0 92.0 92.6  PLT 398 315 296   Cardiac Enzymes: No results for input(s): CKTOTAL,  CKMB, CKMBINDEX, TROPONINI in the last 168 hours. BNP: Invalid input(s): POCBNP CBG: No results for input(s): GLUCAP in the last 168 hours. D-Dimer No results for input(s): DDIMER in the last 72 hours. Hgb A1c No results for input(s): HGBA1C in the last 72 hours. Lipid Profile No results for input(s): CHOL, HDL, LDLCALC, TRIG, CHOLHDL, LDLDIRECT in the last 72 hours. Thyroid function studies No results for input(s): TSH, T4TOTAL, T3FREE, THYROIDAB in the last 72 hours.  Invalid input(s): FREET3 Anemia work up No results for input(s): VITAMINB12, FOLATE, FERRITIN, TIBC, IRON, RETICCTPCT in the last 72 hours. Microbiology Recent Results (from the past 240 hour(s))  SARS CORONAVIRUS 2 (TAT 6-24 HRS) Nasopharyngeal Nasopharyngeal Swab     Status: None   Collection Time: 04/21/19  6:46 AM   Specimen: Nasopharyngeal Swab  Result Value Ref Range Status   SARS Coronavirus 2 NEGATIVE NEGATIVE Final    Comment: (NOTE) SARS-CoV-2  target nucleic acids are NOT DETECTED. The SARS-CoV-2 RNA is generally detectable in upper and lower respiratory specimens during the acute phase of infection. Negative results do not preclude SARS-CoV-2 infection, do not rule out co-infections with other pathogens, and should not be used as the sole basis for treatment or other patient management decisions. Negative results must be combined with clinical observations, patient history, and epidemiological information. The expected result is Negative. Fact Sheet for Patients: HairSlick.no Fact Sheet for Healthcare Providers: quierodirigir.com This test is not yet approved or cleared by the [place] FDA and  has been authorized for detection and/or diagnosis of SARS-CoV-2 by FDA under an Emergency Use Authorization (EUA). This EUA will remain  in effect (meaning this test can be used) for the duration of the COVID-19 declaration under Section 56 4(b)(1) of the Act, 21 U.S.C. section 360bbb-3(b)(1), unless the authorization is terminated or revoked sooner. Performed at [facility], [address], [place], [place] [address]      Discharge Instructions:   Discharge Instructions    Diet - low sodium heart healthy   Complete by: As directed    Discharge instructions   Complete by: As directed    Patient unable to drive, operate heavy machinery, perform activities at heights and participate in water activities until released by outpatient physician.   Increase activity slowly   Complete by: As directed      Allergies as of 04/22/2019      Reactions   Bupropion    Other reaction(s): Seizures   Latex Rash      Medication List    STOP taking these medications   HYDROcodone-acetaminophen 5-325 MG tablet Commonly known as: NORCO/VICODIN     TAKE these medications   acetaminophen 325 MG tablet Commonly known as: TYLENOL Take 2 tablets (650 mg total) by mouth  every 4 (four) hours as needed for mild pain (or temp >/= 99.5).   ibuprofen 600 MG tablet Commonly known as: ADVIL Take 1 tablet (600 mg total) by mouth every 8 (eight) hours as needed for up to 4 days for moderate pain.         Time coordinating discharge: 28 minutes  Signed:  Kathe Wirick  [facility] 04/22/2019, 10:58 AM

## 2019-04-22 NOTE — Progress Notes (Addendum)
Subjective: No further seizure activity since admission.    Objective: Current vital signs: BP (!) 158/86 (BP Location: Left Arm)   Pulse 88   Temp 98.6 F (37 C) (Oral)   Resp 18   Ht 5\' 10"  (1.778 m)   Wt 70.3 kg   SpO2 99%   BMI 22.24 kg/m  Vital signs in last 24 hours: Temp:  [98.5 F (36.9 C)-98.6 F (37 C)] 98.6 F (37 C) (04/02 2336) Pulse Rate:  [70-88] 88 (04/02 1508) Resp:  [18] 18 (04/02 1508) BP: (150-158)/(83-86) 158/86 (04/02 2336) SpO2:  [97 %-99 %] 99 % (04/02 1508)  Intake/Output from previous day: 04/02 0701 - 04/03 0700 In: 702.9 [P.O.:240; I.V.:462.9] Out: -  Intake/Output this shift: No intake/output data recorded. Nutritional status:  Diet Order            Diet regular Room service appropriate? Yes; Fluid consistency: Thin  Diet effective now              Neurologic Exam: Mental Status: Alert, oriented, thought content appropriate.  Speech fluent without evidence of aphasia.  Able to follow 3 step commands without difficulty. Cranial Nerves: II: Discs flat bilaterally; Visual fields grossly normal, pupils equal, round, reactive to light and accommodation III,IV, VI: ptosis not present, extra-ocular motions intact bilaterally V,VII: smile symmetric, facial light touch sensation normal bilaterally VIII: hearing normal bilaterally IX,X: gag reflex present XI: bilateral shoulder shrug XII: midline tongue extension Motor: 5/5 throughout  Lab Results: Basic Metabolic Panel: Recent Labs  Lab 04/20/19 1834 04/21/19 0427  NA 138 139  K 3.8 3.9  CL 101 105  CO2 20* 23  GLUCOSE 198* 115*  BUN 12 10  CREATININE 1.12 1.12  CALCIUM 9.5 8.9    Liver Function Tests: Recent Labs  Lab 04/21/19 0427  AST 51*  ALT 31  ALKPHOS 96  BILITOT 1.1  PROT 7.6  ALBUMIN 4.6   No results for input(s): LIPASE, AMYLASE in the last 168 hours. No results for input(s): AMMONIA in the last 168 hours.  CBC: Recent Labs  Lab 04/20/19 1834  04/21/19 0427 04/22/19 0544  WBC 15.1* 21.2* 12.9*  NEUTROABS  --   --  8.4*  HGB 16.1 16.2 16.0  HCT 46.8 45.7 46.3  MCV 93.0 92.0 92.6  PLT 398 315 296    Cardiac Enzymes: No results for input(s): CKTOTAL, CKMB, CKMBINDEX, TROPONINI in the last 168 hours.  Lipid Panel: No results for input(s): CHOL, TRIG, HDL, CHOLHDL, VLDL, LDLCALC in the last 168 hours.  CBG: No results for input(s): GLUCAP in the last 168 hours.  Microbiology: Results for orders placed or performed during the hospital encounter of 04/21/19  SARS CORONAVIRUS 2 (TAT 6-24 HRS) Nasopharyngeal Nasopharyngeal Swab     Status: None   Collection Time: 04/21/19  6:46 AM   Specimen: Nasopharyngeal Swab  Result Value Ref Range Status   SARS Coronavirus 2 NEGATIVE NEGATIVE Final    Comment: (NOTE) SARS-CoV-2 target nucleic acids are NOT DETECTED. The SARS-CoV-2 RNA is generally detectable in upper and lower respiratory specimens during the acute phase of infection. Negative results do not preclude SARS-CoV-2 infection, do not rule out co-infections with other pathogens, and should not be used as the sole basis for treatment or other patient management decisions. Negative results must be combined with clinical observations, patient history, and epidemiological information. The expected result is Negative. Fact Sheet for Patients: SugarRoll.be Fact Sheet for Healthcare Providers: https://www.woods-mathews.com/ This test is not yet approved  or cleared by the [place] and  has been authorized for detection and/or diagnosis of SARS-CoV-2 by FDA under an Emergency Use Authorization (EUA). This EUA will remain  in effect (meaning this test can be used) for the duration of the COVID-19 declaration under Section 56 4(b)(1) of the Act, 21 U.S.C. section 360bbb-3(b)(1), unless the authorization is terminated or revoked sooner. Performed at [facility], [address] [place], [place] [address]     Coagulation Studies: No results for input(s): LABPROT, INR in the last 72 hours.  Imaging: EEG  Result Date: 04/22/2019 Thana Farr, MD     04/22/2019  8:34 AM ELECTROENCEPHALOGRAM REPORT Patient: Kyle Benitez       Room #: 117A-AA EEG No. ID: [id number] Age: 24 y.o.        Sex: male Requesting Physician: Myriam Forehand Report Date:  04/22/2019       Interpreting Physician: Thana Farr History: Kyle Benitez is an 24 y.o. male with seizures Medications: Lovenox Conditions of Recording:  This is a 21 channel routine scalp EEG performed with bipolar and monopolar montages arranged in accordance to the international 10/20 system of electrode placement. One channel was dedicated to EKG recording. The patient is in the awake, drowsy and asleep states. Description:  The waking background activity consists of a low voltage, symmetrical, fairly well organized, 10 Hz alpha activity, seen from the parieto-occipital and posterior temporal regions.  Low voltage fast activity, poorly organized, is seen anteriorly and is at times superimposed on more posterior regions.  A mixture of theta and alpha rhythms are seen from the central and temporal regions. The patient drowses with slowing to irregular, low voltage theta and beta activity.  The patient goes in to a light sleep with symmetrical sleep spindles, vertex central sharp transients and irregular slow activity.  Hyperventilation was not performed.  Intermittent photic stimulation was performed but failed to illicit any change in the tracing.  IMPRESSION: Normal electroencephalogram, awake, asleep and with activation procedures. Thana Farr, MD Neurology [phone] 04/22/2019, 8:32 AM   DG Ribs Unilateral W/Chest Left  Result Date: 04/20/2019 CLINICAL DATA:  Fall with rib pain EXAM: LEFT RIBS AND CHEST - 3+ VIEW COMPARISON:  None. FINDINGS: No fracture or other bone lesions are seen involving the ribs. There is no evidence  of pneumothorax or pleural effusion. Both lungs are clear. Heart size and mediastinal contours are within normal limits. IMPRESSION: Negative. Electronically Signed   By: Jasmine Pang M.D.   On: 04/20/2019 19:38   DG Forearm Left  Result Date: 04/20/2019 CLINICAL DATA:  Extremity pain EXAM: LEFT FOREARM - 2 VIEW COMPARISON:  None. FINDINGS: There is no evidence of fracture or other focal bone lesions. Soft tissues are unremarkable. IMPRESSION: Negative. Electronically Signed   By: Jasmine Pang M.D.   On: 04/20/2019 19:33   CT Head Wo Contrast  Result Date: 04/20/2019 CLINICAL DATA:  Larey Seat with subsequent seizure like activity. EXAM: CT HEAD WITHOUT CONTRAST TECHNIQUE: Contiguous axial images were obtained from the base of the skull through the vertex without intravenous contrast. COMPARISON:  head CT 03/20/2016 FINDINGS: Brain: The brain shows a normal appearance without evidence of malformation, atrophy, old or acute small or large vessel infarction, mass lesion, hemorrhage, hydrocephalus or extra-axial collection. Vascular: No hyperdense vessel. No evidence of atherosclerotic calcification. Skull: Normal.  No traumatic finding.  No focal bone lesion. Sinuses/Orbits: Sinuses are clear. Orbits appear normal. Mastoids are clear. Other: None significant IMPRESSION: Normal head CT.  Electronically Signed   By: Paulina Fusi M.D.   On: 04/20/2019 20:11   MR BRAIN W WO CONTRAST  Result Date: 04/21/2019 CLINICAL DATA:  Seizure EXAM: MRI HEAD WITHOUT AND WITH CONTRAST TECHNIQUE: Multiplanar, multiecho pulse sequences of the brain and surrounding structures were obtained without and with intravenous contrast. CONTRAST:  75mL GADAVIST GADOBUTROL 1 MMOL/ML IV SOLN COMPARISON:  None. FINDINGS: Motion artifact is present. Brain: There is no acute infarction or intracranial hemorrhage. There is no intracranial mass, mass effect, or edema. There is no hydrocephalus or extra-axial fluid collection. Hippocampi are symmetric  and demonstrate normal size and signal. No abnormal enhancement. Vascular: Major vessel flow voids at the skull base are preserved. Skull and upper cervical spine: Normal marrow signal is preserved. Sinuses/Orbits: Small left maxillary sinus retention cyst. Orbits are unremarkable. Other: Sella is unremarkable.  Mastoid air cells are clear. IMPRESSION: No structural abnormality identified. Electronically Signed   By: Guadlupe Spanish M.D.   On: 04/21/2019 17:56   DG Shoulder Left  Result Date: 04/21/2019 CLINICAL DATA:  Left shoulder pain EXAM: LEFT SHOULDER - 2+ VIEW COMPARISON:  None. FINDINGS: There is no evidence of fracture or dislocation. There is no evidence of arthropathy or other focal bone abnormality. Soft tissues are unremarkable. IMPRESSION: Negative. Electronically Signed   By: Marnee Spring M.D.   On: 04/21/2019 04:34   DG Humerus Left  Result Date: 04/20/2019 CLINICAL DATA:  Larey Seat, injury EXAM: LEFT HUMERUS - 2+ VIEW COMPARISON:  None. FINDINGS: Frontal and lateral views of the left humerus are obtained. No fractures. Alignment of the left shoulder and elbow is anatomic. Soft tissues are normal. IMPRESSION: 1. Unremarkable left humerus. Electronically Signed   By: Sharlet Salina M.D.   On: 04/20/2019 18:59    Medications:  I have reviewed the patient's current medications. Scheduled: . enoxaparin (LOVENOX) injection  40 mg Subcutaneous Q24H    Assessment/Plan: 24 year old male presenting with recurrent seizures.  Seizures likely provoked.  Patient admits to recent benzodiazepine discontinuation.  UDS positive for opiates and THC as well.  Has been taking an herbal stimulant. MRI of the brain personally reviewed and is unremarkable.  EEG with normal background.  Recommendations: 1. Patient stable from a neurological standpoint for discharge on no anticonvulsant therapy. Advice on recreational activities provided. 2. Patient unable to drive, operate heavy machinery, perform  activities at heights and participate in water activities until release by outpatient physician.   LOS: 0 days   Thana Farr, MD Neurology [phone] 04/22/2019  9:57 AM

## 2019-05-01 ENCOUNTER — Ambulatory Visit: Payer: Commercial Managed Care - PPO

## 2019-05-15 ENCOUNTER — Other Ambulatory Visit: Payer: Self-pay | Admitting: Orthopedic Surgery

## 2019-05-15 DIAGNOSIS — S43005A Unspecified dislocation of left shoulder joint, initial encounter: Secondary | ICD-10-CM

## 2019-05-29 ENCOUNTER — Other Ambulatory Visit: Payer: Self-pay

## 2019-05-29 ENCOUNTER — Ambulatory Visit
Admission: RE | Admit: 2019-05-29 | Discharge: 2019-05-29 | Disposition: A | Discharge status: Home / Self Care | Payer: Commercial Managed Care - PPO | Source: Ambulatory Visit | Attending: Orthopedic Surgery | Admitting: Orthopedic Surgery

## 2019-05-29 DIAGNOSIS — S42292A Other displaced fracture of upper end of left humerus, initial encounter for closed fracture: Secondary | ICD-10-CM | POA: Insufficient documentation

## 2019-05-29 DIAGNOSIS — X58XXXA Exposure to other specified factors, initial encounter: Secondary | ICD-10-CM | POA: Insufficient documentation

## 2019-05-29 DIAGNOSIS — S43005A Unspecified dislocation of left shoulder joint, initial encounter: Secondary | ICD-10-CM | POA: Diagnosis not present

## 2019-05-29 DIAGNOSIS — S46812A Strain of other muscles, fascia and tendons at shoulder and upper arm level, left arm, initial encounter: Secondary | ICD-10-CM | POA: Insufficient documentation

## 2019-05-29 MED ORDER — LIDOCAINE HCL (PF) 1 % IJ SOLN
5.0000 mL | Freq: Once | INTRAMUSCULAR | Status: AC
  Administered 2019-05-29: 14:00:00 5 mL
  Filled 2019-05-29: qty 5

## 2019-05-29 MED ORDER — IOHEXOL 180 MG/ML  SOLN
20.0000 mL | Freq: Once | INTRAMUSCULAR | Status: AC | PRN
  Administered 2019-05-29: 15 mL

## 2019-05-29 MED ORDER — GADOBUTROL 1 MMOL/ML IV SOLN
2.0000 mL | Freq: Once | INTRAVENOUS | Status: AC | PRN
  Administered 2019-05-29: 14:00:00 0.05 mL

## 2019-05-29 MED ORDER — SODIUM CHLORIDE (PF) 0.9 % IJ SOLN
10.0000 mL | INTRAMUSCULAR | Status: DC | PRN
  Administered 2019-05-29: 14:00:00 5 mL

## 2020-02-15 LAB — CMP (EXT)
ALT/SGPT (EXT): 19 U/L (ref 10–55)
AST/SGOT (EXT): 22 U/L (ref 10–40)
Albumin (EXT): 4.8 g/dL (ref 3.3–5.0)
Alkaline Phosphatase (EXT): 94 U/L (ref 45–115)
Anion Gap (EXT): 14 mmol/L (ref 3–17)
BUN (EXT): 12 mg/dL (ref 8–25)
Bilirubin, Total (EXT): 0.8 mg/dL (ref 0.0–1.0)
CO2 (EXT): 26 mmol/L (ref 23–32)
CalciumCalcium (EXT): 9.4 mg/dL (ref 8.5–10.5)
Chloride (EXT): 97 mmol/L — ABNORMAL LOW (ref 98–108)
Creatinine (EXT): 0.96 mg/dL (ref 0.60–1.50)
GFR Estimated (Calc) (EXT): 114 mL/min/1.73m2 (ref >59)
Globulin (EXT): 2.3 g/dL (ref 1.9–4.1)
Glucose (EXT): 89 mg/dL (ref 70–110)
Potassium (EXT): 3.7 mmol/L (ref 3.4–5.0)
Protein (EXT): 7.1 g/dL (ref 6.0–8.3)
Sodium (EXT): 137 mmol/L (ref 135–145)

## 2020-06-14 ENCOUNTER — Encounter

## 2020-06-18 ENCOUNTER — Other Ambulatory Visit (HOSPITAL_BASED_OUTPATIENT_CLINIC_OR_DEPARTMENT_OTHER): Admitting: Sports Medicine

## 2020-06-18 ENCOUNTER — Ambulatory Visit
Admit: 2020-06-18 | Discharge: 2020-06-18 | Payer: PRIVATE HEALTH INSURANCE | Attending: Sports Medicine | Primary: Student in an Organized Health Care Education/Training Program

## 2020-06-18 ENCOUNTER — Inpatient Hospital Stay
Admit: 2020-06-18 | Payer: PRIVATE HEALTH INSURANCE | Primary: Student in an Organized Health Care Education/Training Program

## 2020-06-18 ENCOUNTER — Other Ambulatory Visit

## 2020-06-18 ENCOUNTER — Encounter (HOSPITAL_BASED_OUTPATIENT_CLINIC_OR_DEPARTMENT_OTHER): Admitting: Sports Medicine

## 2020-06-18 VITALS — Ht 70.0 in | Wt 160.0 lb

## 2020-06-18 DIAGNOSIS — M25512 Pain in left shoulder: Secondary | ICD-10-CM

## 2020-06-18 NOTE — Progress Notes (Signed)
[facility] Note    Patient Name: Richard Hodges  MRN: [id number]    Chief Complaint:    1. Left shoulder instability     History of Present Illness:  The patient is a 25 year old male who presents here today for evaluation of left shoulder instability.  He reports suffering a left shoulder dislocation last year while having a seizure.  He states he was told the seizure was most likely due to improper tapering from benzodiazepines.  He has not had a seizure since.  He states he has had multiple recurrent episodes of subluxations and dislocations over the past year. He does have pain in his shoulder after his shoulder subluxates. He attended physical therapy for several months last summer and has been attending PT again over the past several months with minimal relief. He is hesitate to weight lift and move his arm into certain positions due to fear it will dislocate. He currently works as a Firefighter.     Allergies:  Allergies   Allergen Reactions   ? Latex      Medical History:  Past Medical History:   Diagnosis Date   ? Anxiety and depression    ? Epilepsy (CMS/HCC)        Surgical History:  Past Surgical History:   Procedure Laterality Date   ? ACHILLES TENDON SURGERY     ? NOSE SURGERY         Current Medications:  Current Outpatient Medications   Medication Sig Dispense Refill   ? lisdexamfetamine (Vyvanse) 30 mg capsule Take 30 mg by mouth in the morning.       No current facility-administered medications for this visit.       Social history:  Tobacco Use: Low Risk    ? Smoking Tobacco Use: Never Smoker   ? Smokeless Tobacco Use: Never Used     Alcohol Use: Not on file     Physical Exam  On examination today, the patient is a well-appearing, pleasant 25 year-old male in no apparent distress.     Examination of the Left shoulder reveals skin is clean and intact. There is no erythema, edema, ecchymosis, or bony deformity. There is no tenderness over the bicepital groove, AC  joint, lateral cuff or posterior joint line. AROM seated: forward elevation 170, ER 60, IR to lumbar spine. Supine AROM: forward elevation 180, ER 60, IR 90. No crepitus. Strength 5/5 supraspinatus, infraspinatus, and subscapularis. Negative Hawkins.  Negative Speed and Yergason's biceps signs. Positive O'Brien's. Positive apprehension on supine exam. Hands are warm and well perfused and neurologic sensation intact to light touch distally.    Imaging: Radiographs of the Left shoulder were obtained today and reviewed. These demonstrate no acute fracture or dislocation. There is evidence of a hill sachs lesion.    Problem List Items Addressed This Visit    None     Visit Diagnoses     Left shoulder pain, unspecified chronicity    -  Primary    Relevant Orders    XR SHOULDER 2+ VIEWS LEFT    CT SHOULDER WO CONTRAST LEFT    Instability of left shoulder joint        Relevant Orders    MR SHOULDER ARTHROGRAM LEFT    XR SHOULDER ARTHROGRAM LEFT    CT SHOULDER WO CONTRAST LEFT        Assessment/Plan:  The patient was seen and evaluated with Dr. Leandro Reasoner today.  He is a 25 year old male  who sustained a left shoulder dislocation one year ago.  He has been experiencing recurrent subluxation/dislocation since his initial injury.  Radiographs of the left shoulder were obtained today, reviewed and discussed with the patient.  We explained that there is evidence of a Hill-Sachs lesion.  Given his prior traumatic dislocation, persistent instability and lack of relief from conservative measures we recommend ordering additional imaging. We recommend ordering a left shoulder MR arthrogram and CT scan with 3D reconstruction with and without humeral head subtraction to evaluate the degree of bone loss. We would like to see him back for follow up after the MRI and CT scan to discuss the results and further treatment. He does prefer to hold off on any surgical treatment until the Fall, therefore we recommend he have the imaging in August.  All questions were answered and patient in agreement with the plan.    Follow up: After CT scan and MR arthrogram    Jacqlyn Larsen, PA

## 2020-06-19 ENCOUNTER — Telehealth (HOSPITAL_BASED_OUTPATIENT_CLINIC_OR_DEPARTMENT_OTHER): Admitting: Sports Medicine

## 2020-06-19 NOTE — Telephone Encounter (Signed)
Call patient to give him date of CT scan; however his mailbox is full. Will try again later.

## 2020-06-19 NOTE — Telephone Encounter (Signed)
-----   Message from Jacqlyn Larsen, [place] sent at 06/18/2020  2:22 PM EDT -----  Richard Hodges, this patient needs a left shoulder CT scan with 3D reconstruction with and without humeral head subtraction. I placed the order. Thank you!

## 2020-06-20 NOTE — Telephone Encounter (Signed)
Voicemail box was cleared. Left voicemail for the patient with CT appointment details. Will send letter as well.

## 2020-06-25 ENCOUNTER — Other Ambulatory Visit (HOSPITAL_BASED_OUTPATIENT_CLINIC_OR_DEPARTMENT_OTHER)

## 2020-07-15 IMAGING — MR MR SHOULDER*L* W/ CM
6 series · 40 of 40 positions shown · IV contrast (agent unspecified)
Comparison: X-ray 04/21/2019

CLINICAL DATA: Left shoulder dislocation 04/20/2019

EXAM:
MR ARTHROGRAM OF THE LEFT SHOULDER
TECHNIQUE: Multiplanar, multisequence MR imaging of the left shoulder was
performed following the administration of intra-articular contrast.
CONTRAST:  See Injection Documentation.

[Series 5: T1 fat-sat · axial · left · 4.0mm · 0.55mm/px · z∈[-9,+111]mm · 6 of 25 slices shown (1 of 3)]
[im 1/25]
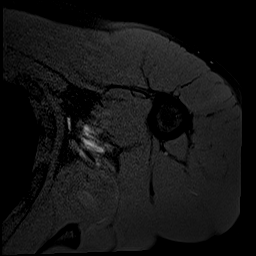
[im 5/25]
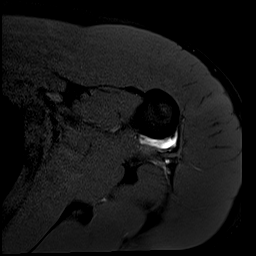
[im 10/25]
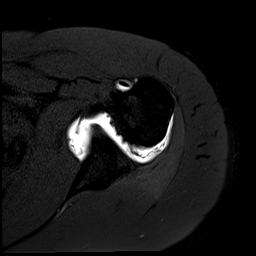
[im 15/25]
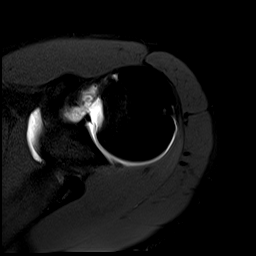
[im 20/25]
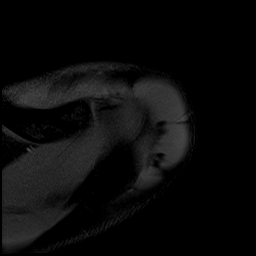
[im 25/25]
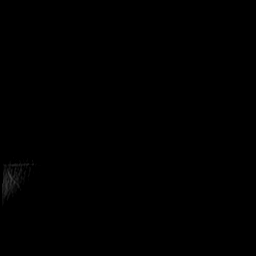

[Series 6: T1 fat-sat · oblique · left · 4.0mm · 0.55mm/px · 7 of 25 slices shown (2 of 3)]
[im 1/25]
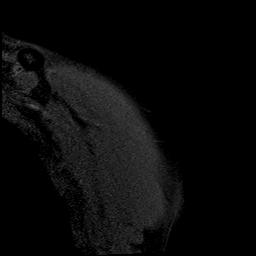
[im 5/25]
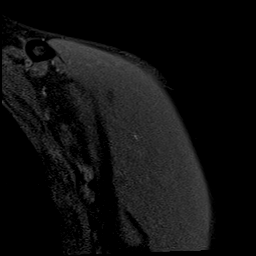
[im 9/25]
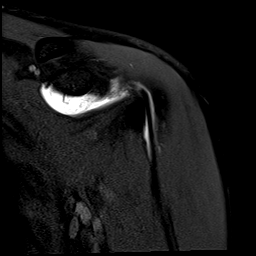
[im 13/25]
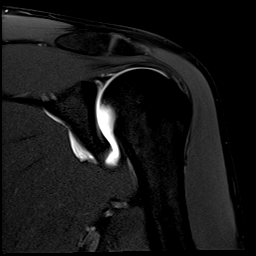
[im 17/25]
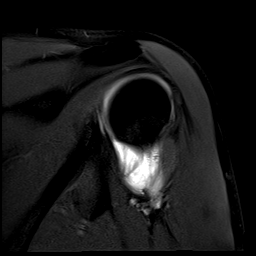
[im 21/25]
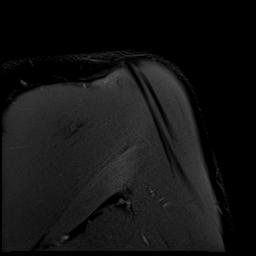
[im 25/25]
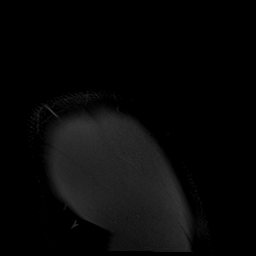

[Series 7: T2 fat-sat · oblique · left · 4.0mm · 0.55mm/px · 7 of 25 slices shown (1 of 2)]
[im 1/25]
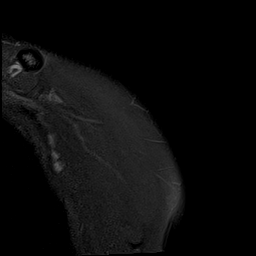
[im 5/25]
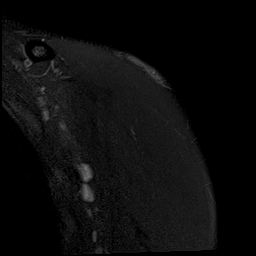
[im 9/25]
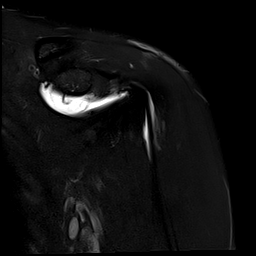
[im 13/25]
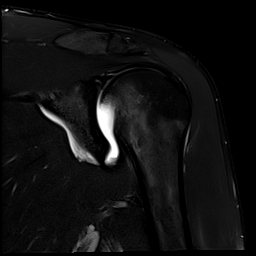
[im 17/25]
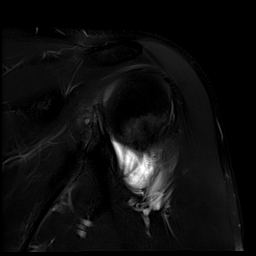
[im 21/25]
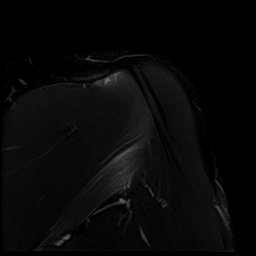
[im 25/25]
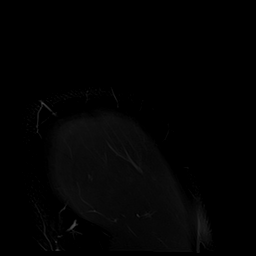

[Series 8: T1 · oblique · left · 4.0mm · 0.51mm/px · 7 of 25 slices shown]
[im 1/25]
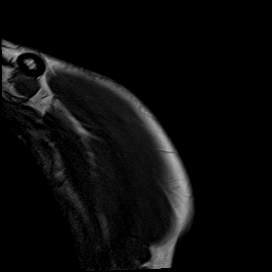
[im 5/25]
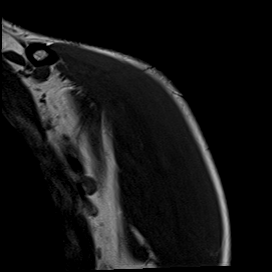
[im 9/25]
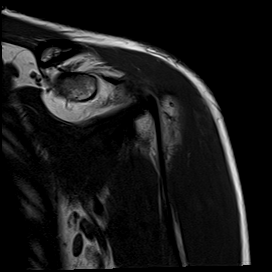
[im 13/25]
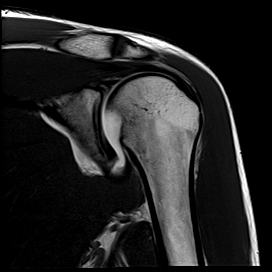
[im 17/25]
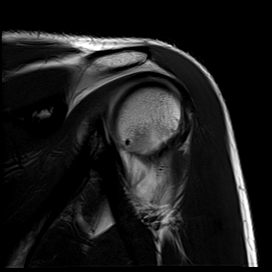
[im 21/25]
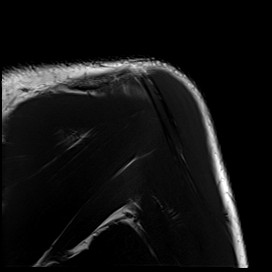
[im 25/25]
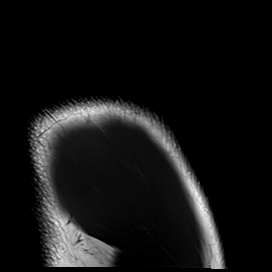

[Series 9: T2 fat-sat · sagittal · left · 4.0mm · 0.55mm/px · 6 of 23 slices shown (2 of 2)]
[im 1/23]
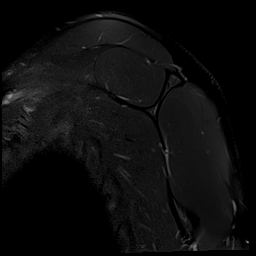
[im 5/23]
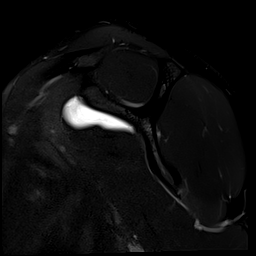
[im 9/23]
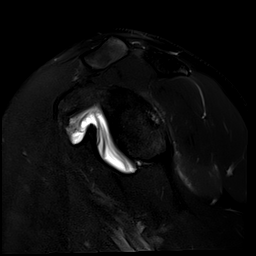
[im 14/23]
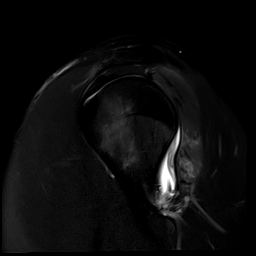
[im 18/23]
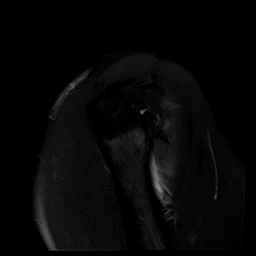
[im 23/23]
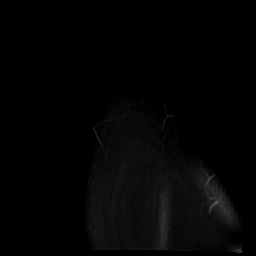

[Series 11: T1 fat-sat · sagittal · left · 4.0mm · 0.62mm/px · 7 of 26 slices shown (3 of 3)]
[im 1/26]
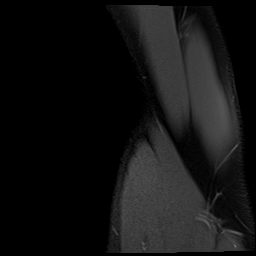
[im 5/26]
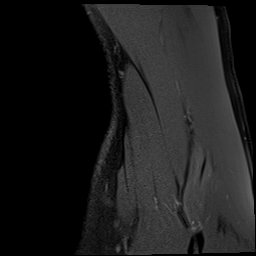
[im 9/26]
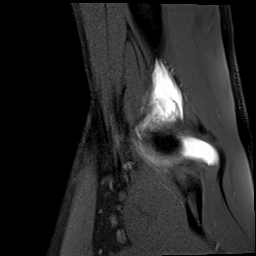
[im 13/26]
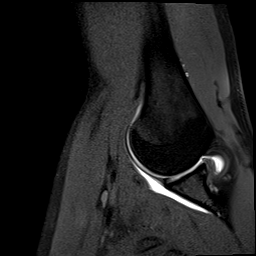
[im 17/26]
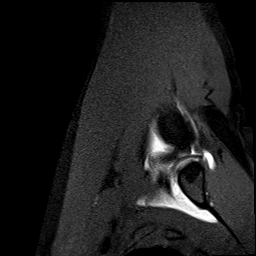
[im 21/26]
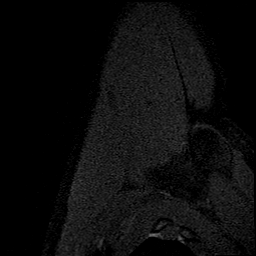
[im 26/26]
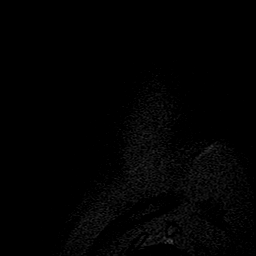

[40 of 40 positions shown; findings below may reference images not displayed]

FINDINGS: Rotator cuff: Intact supraspinatus, infraspinatus, subscapularis,
and teres minor tendons.

Muscles: Intramuscular edema within the teres minor muscle with
probable low-grade muscle tear involving the more inferior aspect of
the distal muscle belly. Preserved rotator cuff muscle bulk without
atrophy or fatty infiltration.

Biceps long head: Intact.

Acromioclavicular Joint: Normal acromioclavicular joint. No fluid or
contrast within the subacromial-subdeltoid bursa.

Glenohumeral Joint: Well distended with injected contrast.
Maintained alignment without dislocation. No displaced cartilage
defect.

Labrum: Posteroinferior labral tear extending into the posterior
labrum involving approximately the [DATE] to [DATE] positions of the
labrum (series 5, images 12-15). No displaced labroligamentous
fragment or evidence of periosteal stripping. Prominent superior
labral sulcus.

Bones: Large impaction fracture of the anteromedial humeral head
(series 5, image 13). With mild associated bone marrow edema. No
associated glenoid fracture.
IMPRESSION: 1. Findings of recent posterior glenohumeral joint dislocation with
large reverse Hill-Sachs impaction fracture and posterior labral
tear.
2. Posttraumatic intramuscular edema and low-grade tear involving
the teres minor muscle.
3. Intact rotator cuff tendons.

## 2020-07-31 LAB — CMP (EXT)
ALT/SGPT (EXT): 19 U/L (ref 9.0–67.0)
AST/SGOT (EXT): 21 U/L (ref 13–39)
Albumin (EXT): 4.8 g/dL (ref 3.5–5.0)
Alkaline Phosphatase (EXT): 96 U/L (ref 25–100)
Anion Gap (EXT): 8 (ref 7–16)
BUN (EXT): 15 mg/dL (ref 7–25)
Bilirubin, Total (EXT): 1.2 mg/dL (ref 0.3–1.2)
CO2 (EXT): 31 MMOL/L — ABNORMAL HIGH (ref 19–28)
CalciumCalcium (EXT): 9.9 mg/dL (ref 8–10.5)
Chloride (EXT): 102 MMOL/L (ref 98–110)
Creatinine (EXT): 1.27 mg/dL (ref 0.7–1.3)
Glucose (EXT): 84 mg/dL (ref 70–100)
Potassium (EXT): 4 MMOL/L (ref 3.1–5.3)
Protein (EXT): 7.5 g/dL (ref 6.8–8.6)
Sodium (EXT): 141 MMOL/L (ref 135–145)
eGFR - Creat CKD-EPI (EXT): 81 mL/min/{1.73_m2} (ref >59)

## 2020-08-01 LAB — CHLAMYDIA/GC (EXT)
Chlamydia Trachomatis, Urine (EXT): NEGATIVE TEXT
Neisseria Gonorrhoeae, Urine (EXT): NEGATIVE TEXT

## 2020-08-21 ENCOUNTER — Encounter

## 2020-08-22 ENCOUNTER — Ambulatory Visit: Payer: PRIVATE HEALTH INSURANCE | Primary: Student in an Organized Health Care Education/Training Program

## 2020-09-02 ENCOUNTER — Ambulatory Visit: Payer: PRIVATE HEALTH INSURANCE | Primary: Student in an Organized Health Care Education/Training Program

## 2020-09-02 ENCOUNTER — Other Ambulatory Visit (HOSPITAL_BASED_OUTPATIENT_CLINIC_OR_DEPARTMENT_OTHER)

## 2020-09-03 ENCOUNTER — Inpatient Hospital Stay
Admit: 2020-09-03 | Payer: PRIVATE HEALTH INSURANCE | Primary: Student in an Organized Health Care Education/Training Program

## 2020-09-03 ENCOUNTER — Encounter

## 2020-09-03 ENCOUNTER — Inpatient Hospital Stay (HOSPITAL_BASED_OUTPATIENT_CLINIC_OR_DEPARTMENT_OTHER): Admit: 2020-09-03 | Discharge: 2020-09-03 | Disposition: A | Discharge status: Home / Self Care

## 2020-09-03 ENCOUNTER — Other Ambulatory Visit

## 2020-09-03 DIAGNOSIS — M25312 Other instability, left shoulder: Secondary | ICD-10-CM

## 2020-09-03 MED ORDER — gadoterate meglumine (Dotarem) 0.5 mmol/mL injection 15 mL
0.5 | Freq: Once | INTRAVENOUS | Status: DC
Start: 2020-09-03 — End: 2020-09-04

## 2020-09-03 MED ORDER — iopamidol (Isovue-300) 61 % injection 10 mL
300 | Freq: Once | INTRAVENOUS | Status: AC
Start: 2020-09-03 — End: 2020-09-03
  Administered 2020-09-03: 19:00:00 10 mL

## 2020-09-03 MED ORDER — ropivacaine (Naropin) 0.2 % solution 5 mL
0.2 | Freq: Once | INTRAMUSCULAR | Status: DC
Start: 2020-09-03 — End: 2020-09-04

## 2020-09-03 MED ORDER — lidocaine (Xylocaine) 10 mg/mL (1 %) injection 100 mg
10 | Freq: Once | INTRAMUSCULAR | Status: DC
Start: 2020-09-03 — End: 2020-09-04

## 2020-09-17 ENCOUNTER — Other Ambulatory Visit (HOSPITAL_BASED_OUTPATIENT_CLINIC_OR_DEPARTMENT_OTHER): Admitting: Surgical

## 2020-09-18 ENCOUNTER — Encounter

## 2020-09-18 ENCOUNTER — Other Ambulatory Visit

## 2020-09-18 ENCOUNTER — Inpatient Hospital Stay
Admit: 2020-09-18 | Payer: PRIVATE HEALTH INSURANCE | Primary: Student in an Organized Health Care Education/Training Program

## 2020-09-18 DIAGNOSIS — M25512 Pain in left shoulder: Secondary | ICD-10-CM

## 2020-10-01 ENCOUNTER — Ambulatory Visit
Payer: PRIVATE HEALTH INSURANCE | Attending: Sports Medicine | Primary: Student in an Organized Health Care Education/Training Program

## 2020-10-15 ENCOUNTER — Other Ambulatory Visit

## 2020-10-15 ENCOUNTER — Encounter (HOSPITAL_BASED_OUTPATIENT_CLINIC_OR_DEPARTMENT_OTHER): Admitting: Sports Medicine

## 2020-10-15 ENCOUNTER — Ambulatory Visit
Admit: 2020-10-15 | Discharge: 2020-10-15 | Payer: PRIVATE HEALTH INSURANCE | Attending: Sports Medicine | Primary: Student in an Organized Health Care Education/Training Program

## 2020-10-15 DIAGNOSIS — M25312 Other instability, left shoulder: Secondary | ICD-10-CM

## 2020-10-15 NOTE — Progress Notes (Signed)
 [facility] Note    Patient Name: Richard Hodges  MRN: [id number]    Chief Complaint: left shoulder instability, follow up     History of Present Illness:  Richard Hodges is a 25 y.o. male presents for follow-up left shoulder instability.  To recall he had initial posterior shoulder dislocation after a seizure in April 2021.  He reports he has not had a subsequent seizure since then.  He does however have recurrent subluxation events.  He reports last time this happened was probably this spring.  He has been avoiding any kind of heavy lifting or overhead activities due to concern of possible recurrence.  Denies numbness/tingling in the extremity. He is thinking about having surgery sometime later this fall. He returns to discuss recently obtained MRI and CT scans.     Allergies:  Allergies   Allergen Reactions   . Bupropion      Other reaction(s): Seizures  Other reaction(s): Seizures  Other reaction(s): Seizures  Other reaction(s): Seizures     . Latex Rash       Medical History:  Past Medical History:   Diagnosis Date   . Anxiety and depression    . Epilepsy (CMS/HCC)        Surgical History:  Past Surgical History:   Procedure Laterality Date   . ACHILLES TENDON SURGERY     . NOSE SURGERY         Current Medications:  Current Outpatient Medications   Medication Sig Dispense Refill   . lisdexamfetamine (Vyvanse) 30 mg capsule Take 30 mg by mouth in the morning.       No current facility-administered medications for this visit.       Social history:  Tobacco Use: Low Risk    . Smoking Tobacco Use: Never Smoker   . Smokeless Tobacco Use: Never Used     Alcohol Use: Not on file       Physical Exam  Gen: well-appearing, in NAD, A&O  Examination of the Left shoulder reveals skin is clean and intact. There is no erythema, edema, ecchymosis, or bony deformity. There is no tenderness over the bicepital groove, AC joint, lateral cuff or posterior joint line. AROM seated: forward elevation 170, ER 60, IR to  lumbar spine. Supine AROM: forward elevation 180, ER 60, IR 90. No crepitus. Strength 5/5 supraspinatus, infraspinatus, and subscapularis. Negative Hawkins.  Negative Speed and Yergason's biceps signs. Positive O'Brien's. Positive apprehension on supine exam. Hands are warm and well perfused and neurologic sensation intact to light touch distally.    Imaging:   CT scan obtained 08/31 reviewed today 1.  Reverse Hill-Sachs defect/McLaughlin lesion, compatible with remote posterior shoulder dislocation. 2.  No bony Bankart lesion.  MRI arthrogram obtained 08/16 reviewed today Large circumferential superior/posterior labral tear.        Diagnosis Plan   1. Instability of left shoulder joint       Assessment/Plan:  This patient was seen and examined with Dr. Leandro Reasoner.  He returned today to discuss his recently obtained CT scan as well as MRI arthrogram in setting of previous left shoulder dislocation as well as subsequent subluxation events.  We discussed that he does not have any significant glenoid bone loss however he does have a reverse Hill-Sachs.  Given he has had recurrent instability despite over 1 year of conservative measures, we recommend shoulder arthroscopy with labrum repair. It would be determined intraoperatively if the amount of bone defect would necessitate for [facility] procedure. This was  explained to him in detail. We discussed risks/benefits of surgery including but not limited too infection, recurrent dislocation, blood clots, stiffness, anesthesia problems, allergic reactions, need for further surgery, incisional numbness, pain, re-tear, bleeding, hardware failure, medical problems and death. We also discussed expected recovery timeline in great detail. We will call him next week regarding surgical booking as he is looking at sometime in November. Consent was obtained today in the clinic. All questions/concerns addressed.     Kathrine Cords, PA

## 2020-12-16 ENCOUNTER — Other Ambulatory Visit

## 2020-12-16 ENCOUNTER — Ambulatory Visit
Admission: RE | Admit: 2020-12-16 | Discharge: 2020-12-16 | Disposition: A | Discharge status: Home / Self Care | Source: Ambulatory Visit | Attending: Sports Medicine | Admitting: Sports Medicine

## 2020-12-16 ENCOUNTER — Encounter (HOSPITAL_BASED_OUTPATIENT_CLINIC_OR_DEPARTMENT_OTHER): Admitting: Sports Medicine

## 2020-12-16 ENCOUNTER — Encounter

## 2020-12-16 ENCOUNTER — Encounter (HOSPITAL_BASED_OUTPATIENT_CLINIC_OR_DEPARTMENT_OTHER)
Admission: RE | Disposition: A | Discharge status: Home / Self Care | Source: Ambulatory Visit | Attending: Sports Medicine

## 2020-12-16 ENCOUNTER — Ambulatory Visit (HOSPITAL_BASED_OUTPATIENT_CLINIC_OR_DEPARTMENT_OTHER): Admitting: Critical Care Medicine

## 2020-12-16 SURGERY — Arthroscopy, Shoulder, With Bankart Repair
Anesthesia: General | Site: Shoulder | Laterality: Left | Wound class: Class I/ Clean

## 2020-12-16 MED ORDER — ibuprofen tablet 400 mg
400 | Freq: Four times a day (QID) | ORAL | Status: DC | PRN
Start: 2020-12-16 — End: 2020-12-16

## 2020-12-16 MED ORDER — lidocaine PF (Xylocaine) 20 mg/mL (2 %) injection
20 | INTRAMUSCULAR | Status: DC | PRN
Start: 2020-12-16 — End: 2020-12-16
  Administered 2020-12-16: 09:00:00 60 mg via INTRAVENOUS

## 2020-12-16 MED ORDER — acetaminophen (Tylenol) tablet 650 mg
325 | Freq: Four times a day (QID) | ORAL | Status: DC | PRN
Start: 2020-12-16 — End: 2020-12-16

## 2020-12-16 MED ORDER — propofol (Diprivan) injection
10 | INTRAVENOUS | Status: DC | PRN
Start: 2020-12-16 — End: 2020-12-16
  Administered 2020-12-16: 09:00:00 200 mg via INTRAVENOUS
  Administered 2020-12-16: 09:00:00 50 mg via INTRAVENOUS
  Administered 2020-12-16: 12:00:00 100 mg via INTRAVENOUS

## 2020-12-16 MED ORDER — oxyCODONE (Roxicodone) immediate release tablet 5 mg
5 | Freq: Once | ORAL | Status: DC
Start: 2020-12-16 — End: 2020-12-16

## 2020-12-16 MED ORDER — dexAMETHasone (Decadron) injection
4 | INTRAMUSCULAR | Status: DC | PRN
Start: 2020-12-16 — End: 2020-12-16
  Administered 2020-12-16: 09:00:00 4 mg via INTRAVENOUS

## 2020-12-16 MED ORDER — rocuronium (Zemuron) 50 mg/5 mL (10 mg/mL) injection  - Omnicell Override Pull
50 | INTRAVENOUS | Status: AC
Start: 2020-12-16 — End: ?

## 2020-12-16 MED ORDER — ketorolac (Toradol) injection
30 | INTRAMUSCULAR | Status: DC | PRN
Start: 2020-12-16 — End: 2020-12-16
  Administered 2020-12-16: 11:00:00 30 mg via INTRAVENOUS

## 2020-12-16 MED ORDER — bupivacaine-epinephrine (PF) (Marcaine w/EPI) 0.25 %-1:200,000 injection
0.25 | INTRAMUSCULAR | Status: DC | PRN
Start: 2020-12-16 — End: 2020-12-16
  Administered 2020-12-16: 17:00:00 20 via PERINEURAL

## 2020-12-16 MED ORDER — ceFAZolin (Ancef) 1 gram injection  - Omnicell Override Pull
1 | INTRAMUSCULAR | Status: AC
Start: 2020-12-16 — End: ?

## 2020-12-16 MED ORDER — HYDROmorphone (Dilaudid) injection 0.4 mg
1 | INTRAMUSCULAR | Status: DC | PRN
Start: 2020-12-16 — End: 2020-12-16

## 2020-12-16 MED ORDER — ondansetron (Zofran) injection 4 mg
4 | INTRAMUSCULAR | Status: DC | PRN
Start: 2020-12-16 — End: 2020-12-16

## 2020-12-16 MED ORDER — ibuprofen 600 mg tablet
600 | ORAL_TABLET | Freq: Four times a day (QID) | ORAL | 0 refills | Status: AC | PRN
Start: 2020-12-16 — End: 2020-12-31
  Filled 2020-12-16 (×2): qty 10, 3d supply, fill #0

## 2020-12-16 MED ORDER — fentaNYL (Sublimaze) 50 mcg/mL injection  - Omnicell Override Pull
50 | INTRAMUSCULAR | Status: AC
Start: 2020-12-16 — End: ?

## 2020-12-16 MED ORDER — ondansetron (Zofran) 4 mg/2 mL injection  - Omnicell Override Pull
4 | INTRAMUSCULAR | Status: AC
Start: 2020-12-16 — End: ?

## 2020-12-16 MED ORDER — acetaminophen (Tylenol) 325 mg tablet
325 | ORAL_TABLET | Freq: Three times a day (TID) | ORAL | 0 refills | 8.00000 days | Status: AC
Start: 2020-12-16 — End: 2020-12-31
  Filled 2020-12-16: qty 135, 15d supply, fill #0

## 2020-12-16 MED ORDER — ondansetron (Zofran) injection
4 | INTRAMUSCULAR | Status: DC | PRN
Start: 2020-12-16 — End: 2020-12-16
  Administered 2020-12-16: 11:00:00 4 mg via INTRAVENOUS

## 2020-12-16 MED ORDER — midazolam (Versed) 1 mg/mL injection  - Omnicell Override Pull
1 | INTRAMUSCULAR | Status: AC
Start: 2020-12-16 — End: ?

## 2020-12-16 MED ORDER — HYDROmorphone (Dilaudid) injection
2 | INTRAMUSCULAR | Status: DC | PRN
Start: 2020-12-16 — End: 2020-12-16
  Administered 2020-12-16: 09:00:00 .6 mg via INTRAVENOUS
  Administered 2020-12-16: 12:00:00 .4 mg via INTRAVENOUS

## 2020-12-16 MED ORDER — lidocaine PF (Xylocaine) 20 mg/mL (2 %) injection  - Omnicell Override Pull
20 | INTRAMUSCULAR | Status: AC
Start: 2020-12-16 — End: ?

## 2020-12-16 MED ORDER — fentaNYL (Sublimaze) injection 25 mcg
50 | INTRAMUSCULAR | Status: DC | PRN
Start: 2020-12-16 — End: 2020-12-16
  Administered 2020-12-16: 12:00:00 25 ug via INTRAVENOUS

## 2020-12-16 MED ORDER — sugammadex (Bridion) 100 mg/mL injection  - Omnicell Override Pull
100 | INTRAVENOUS | Status: AC
Start: 2020-12-16 — End: ?

## 2020-12-16 MED ORDER — aspirin 325 mg EC tablet
325 | ORAL_TABLET | Freq: Every day | ORAL | 0 refills | 90.00000 days | Status: AC
Start: 2020-12-16 — End: 2021-01-15
  Filled 2020-12-16: qty 30, 30d supply, fill #0

## 2020-12-16 MED ORDER — rocuronium (ZeMuron) injection
10 | INTRAVENOUS | Status: DC | PRN
Start: 2020-12-16 — End: 2020-12-16
  Administered 2020-12-16: 09:00:00 50 mg via INTRAVENOUS

## 2020-12-16 MED ORDER — oxyCODONE (Roxicodone) 5 mg immediate release tablet
5 | ORAL_TABLET | ORAL | 0 refills | 8.00000 days | Status: AC | PRN
Start: 2020-12-16 — End: 2020-12-21
  Filled 2020-12-16: qty 30, 5d supply, fill #0

## 2020-12-16 MED ORDER — ceFAZolin (Ancef) injection
1 | INTRAMUSCULAR | Status: DC | PRN
Start: 2020-12-16 — End: 2020-12-16
  Administered 2020-12-16: 14:00:00 2 via INTRAVENOUS

## 2020-12-16 MED ORDER — propofol (Diprivan) 10 mg/mL injection  - Omnicell Override Pull
10 | INTRAVENOUS | Status: AC
Start: 2020-12-16 — End: ?

## 2020-12-16 MED ORDER — midazolam (Versed) injection
1 | INTRAMUSCULAR | Status: DC | PRN
Start: 2020-12-16 — End: 2020-12-16
  Administered 2020-12-16: 09:00:00 2 mg via INTRAVENOUS

## 2020-12-16 MED ORDER — fentaNYL (Sublimaze) injection
50 | INTRAMUSCULAR | Status: DC | PRN
Start: 2020-12-16 — End: 2020-12-16
  Administered 2020-12-16: 09:00:00 100 ug via INTRAVENOUS

## 2020-12-16 MED ORDER — sugammadex (Bridion) injection
100 | INTRAVENOUS | Status: DC | PRN
Start: 2020-12-16 — End: 2020-12-16
  Administered 2020-12-16: 11:00:00 100 mg via INTRAVENOUS

## 2020-12-16 MED ORDER — dexMEDEtomidine in NS (Precedex) injection
4 | INTRAVENOUS | Status: DC | PRN
Start: 2020-12-16 — End: 2020-12-16
  Administered 2020-12-16: 10:00:00 20 ug via INTRAVENOUS

## 2020-12-16 MED ORDER — dexAMETHasone (Decadron) 4 mg/mL injection  - Omnicell Override Pull
4 | INTRAMUSCULAR | Status: AC
Start: 2020-12-16 — End: ?

## 2020-12-16 MED ORDER — dexMEDEtomidine in NS (Precedex) 4 mcg/mL injection  - Omnicell Override Pull
4 | INTRAVENOUS | Status: AC
Start: 2020-12-16 — End: ?

## 2020-12-16 MED ORDER — haloperidol lactate (Haldol) injection 1 mg
5 | Freq: Once | INTRAMUSCULAR | Status: DC | PRN
Start: 2020-12-16 — End: 2020-12-16

## 2020-12-16 MED ORDER — EPINEPHrine HCl (PF) (Adrenalin) injection
1 | INTRAMUSCULAR | Status: DC | PRN
Start: 2020-12-16 — End: 2020-12-16
  Administered 2020-12-16: 14:00:00 1 via TOPICAL

## 2020-12-16 MED ORDER — HYDROmorphone (Dilaudid) 2 mg/mL injection  - Omnicell Override Pull
2 | INTRAMUSCULAR | Status: AC
Start: 2020-12-16 — End: ?

## 2020-12-16 MED ORDER — ropivacaine (Naropin) 0.2 % infusion
0.2 | INTRAMUSCULAR | Status: DC | PRN
Start: 2020-12-16 — End: 2020-12-16
  Administered 2020-12-16: 15:00:00 20

## 2020-12-16 MED ORDER — lactated Ringer's infusion
INTRAVENOUS | Status: DC | PRN
Start: 2020-12-16 — End: 2020-12-16
  Administered 2020-12-16: 14:00:00 via INTRAVENOUS

## 2020-12-16 MED ORDER — ketorolac (Toradol) 30 mg/mL (1 mL) injection  - Omnicell Override Pull
30 | INTRAMUSCULAR | Status: AC
Start: 2020-12-16 — End: ?

## 2020-12-16 MED FILL — HYDROMORPHONE 2 MG/ML INJECTION WRAPPER: 2 2 mg/mL | INTRAMUSCULAR | Qty: 1

## 2020-12-16 MED FILL — CEFAZOLIN 1 GRAM SOLUTION FOR INJECTION: 1 1 gram | INTRAMUSCULAR | Qty: 1000

## 2020-12-16 MED FILL — DEXAMETHASONE SODIUM PHOSPHATE 4 MG/ML INJECTION SOLUTION: 4 4 mg/mL | INTRAMUSCULAR | Qty: 1

## 2020-12-16 MED FILL — ROCURONIUM 50 MG/5 ML (10 MG/ML) INTRAVENOUS SYRINGE: 50 50 mg/5 mL (10 mg/mL) | INTRAVENOUS | Qty: 5

## 2020-12-16 MED FILL — MIDAZOLAM 1 MG/ML INJECTION SOLUTION WRAPPER: 1 1 mg/mL | INTRAMUSCULAR | Qty: 2

## 2020-12-16 MED FILL — KETOROLAC 30 MG/ML (1 ML) INJECTION SOLUTION: 30 30 mg/mL (1 mL) | INTRAMUSCULAR | Qty: 1

## 2020-12-16 MED FILL — ONDANSETRON HCL (PF) 4 MG/2 ML INJECTION SOLUTION: 4 4 mg/2 mL | INTRAMUSCULAR | Qty: 2

## 2020-12-16 MED FILL — FENTANYL (PF) 50 MCG/ML INJECTION SOLUTION: 50 50 mcg/mL | INTRAMUSCULAR | Qty: 2

## 2020-12-16 MED FILL — SUGAMMADEX 100 MG/ML INTRAVENOUS SOLUTION: 100 100 mg/mL | INTRAVENOUS | Qty: 2

## 2020-12-16 MED FILL — PROPOFOL 10 MG/ML INTRAVENOUS EMULSION: 10 10 mg/mL | INTRAVENOUS | Qty: 40

## 2020-12-16 MED FILL — LIDOCAINE (PF) 20 MG/ML (2 %) INJECTION SOLUTION: 20 20 mg/mL (2 %) | INTRAMUSCULAR | Qty: 5

## 2020-12-16 MED FILL — DEXMEDETOMIDINE 80 MCG/20 ML (4 MCG/ML) IN 0.9 % SODIUM CHLORIDE IV: 4 4 mcg/mL | INTRAVENOUS | Qty: 20

## 2020-12-16 SURGICAL SUPPLY — 44 items
ANCHOR BIOCOMPOSITE 3X14 W/#2 (Internal and External Fixation) ×2 IMPLANT
ANCHOR FIBERTAK 1.8 KNOTLESS G (Anchor) ×2 IMPLANT
ANCHOR FIBERTAK 1.8 KNOTLESS W (Anchor) ×5 IMPLANT
ANCHOR SUTURE PUSHLCK 2.9X15.5 (Internal and External Fixation) ×1 IMPLANT
APPLICATOR CHLOROPREP 26ML HI (Prep) ×1
BANDAGE COBAN 6INX5YD ST LF TA (Dressing) ×1
BLADE BONECUTTER 4.0 COOLCUT (Surgical Supply General) ×1
BLADE EXCALIBUR HL 4.0 COOLCUT (Surgical Supply General) ×1
BURR  4.0 OVAL 8 FLUTE COOLCUT (Surgical Supply General)
CANNULA TWIST IN 6X7 ST (Surgical Supply General)
CANNULA TWIST IN 8.25X7CM ST (Surgical Supply General) ×2
DRAPE FANFOLD 60X77 LG (Drape) ×2
DRAPE IOBAN 23X17 (Drape) ×1
DRAPE SHOULDER SPLIT SHEET (Drape) ×1
DRAPE SPLIT IMPERVIOUS 60X70 (Drape) ×1
DRESSING GAUZE 4X4 12PLY 10/PK (Dressing) ×1
DRESSING XEROFORM 1X8 ST (Dressing) ×1
ELECTRODE RETURN DUAL PLATE W/ (Pt Monitoring Supply) ×1
GLOVE POLYISOPRENE MICRO SZ8.0 (Glove) ×2
GLOVE POLYISOPRENE ORTHO SZ8.0 (Glove) ×1
GOWN SURGEONS 2XL AAMI LVL 4 (Gown) ×2
KIT SPEAR CVD DISP (Surgical Supply General) ×1
KIT TRIMANO BEACH CHAIR POSITI (Surgical Supply General) ×1
MANIFOLD 4 PORT NEPTUNE2 (Surgical Supply General) ×1
MASK FACE SHOULDER CHAIR DISPO (Endoscopy Supply) ×1
NEEDLE SPINAL 18GX3.5 PNK (Needle And Syringe) ×1
NO LONGER USED USE ITEM 959826 (Medical Supply General) ×1
PACK KNEE ARTHROSCOPY CUSTOM (Custom Pack) ×1
PAD ABD COMBINE 5X9 ST W/WETPR (Dressing) ×1
SLEEVE SCD COMFORT KNEE MED NS (Medical Supply General) ×1
SOLUTION LACT RING IRR 3L (Solution) ×1
STRAP HEAD SHOULDER MODULE OPE (Endoscopy Supply) ×1
SUTURE ETHILON 3 0 18IN PS2 (Suture) ×1
SUTURE FIBERWIRE #2 38IN (Suture) ×1
SUTURE FIBERWIRE #2 50IN BLU (Suture) ×1
SUTURE PDS+ 1X54 TPB1 BLUNT (Suture) ×1
TAPE FIBERTABE 7IN BLU (Suture) ×1
TAPE LABRALTAPE WHT 36IN 1.5MM (Suture) ×1
TUBING ARTHREX PUMP (Tubing) ×1
TUBING CONNECTING SUCTION 7X12 (Tubing) ×2
WAND SUPER TURBOVAC 90 IFS (Surgical Instrument)
WIRE SUTURE LASSO 45D CVD LT (Suture)
WIRE SUTURELASSO 45D RT CRV (Suture) ×2
WIRE SUTURELASSO SD 90D STR (Suture)

## 2020-12-16 NOTE — Anesthesia Post-Procedure Evaluation (Signed)
 Patient: Richard Hodges    Procedure Summary     Date: 12/16/20 Room / Location: [facility] OR 21 / [facility] Operating Room    Anesthesia Start: [phone] Anesthesia Stop: 1208    Procedure: LEFT Shoulder Labral Repair, Slap procedure Merlinda Frederick Procedure (Left: Shoulder) Diagnosis: (Left Posterior Shoulder Instability+Labral Tear)    Surgeons: Chauncey Cruel, MD Responsible Provider: Vonzella Nipple, MD    Anesthesia Type: general ASA Status: 2          Anesthesia Type: general    Vitals Value Taken Time   BP 128/82 12/16/20 1204   Temp 36.1 ?C (97 ?F) 12/16/20 1155   Pulse 65 12/16/20 1208   Resp 14 12/16/20 1208   SpO2 100 % 12/16/20 1208   Vitals shown include unvalidated device data.    Anesthesia Post Evaluation Note:    Patient location during evaluation: PACU  Patient participation: able to participate  Level of consciousness: awake  Cardiovascular and Hydration status: vital signs within acceptable range  Respiratory Status Stable and Airway Patent: yes  Nausea and Vomiting Control Satisfactory: yes  Pain score: 5  Pain management: adequate  Multimodal analgesia pain management approach     Aldrete score reviewed: yes  Vitals reviewed: yes  Unplanned ICU Admission: noPatient has recovered from anesthesia and has returned to baseline mental status, cardiovascular and respiratory function. Pain, nausea, and vomiting are adequately controlled and the patient is adequately hydrated and appropriate for discharge from PACU?: yes      No notable events documented.

## 2020-12-16 NOTE — Op Note (Signed)
 Left Shoulder Posterior Labral Repair & SLAP Repair,  McLaughlin Procedure Operative Note     Date: 12/16/2020  Location: [facility] OR    Name: Tresean Mattix, DOB: Jun 04, 1995, MRN: [id number]    Diagnosis  Preop  Left shoulder instability  Left shoulder posterior labral tear  Left shoulder Reverse Hill Sachs   Left shoulder ? SLAP tear Postop  Left shoulder instability  Left shoulder posterior labral tear  Left shoulder Reverse Hill Sachs   Left shoulder SLAP tear     Procedures  Left Shoulder Posterior Labral Repair (54098)  Left Shoulder SLAP Repair (11914)  Left Shoulder McLaughlin Procedure [phone])    Surgeons      * Chauncey Cruel - Primary   Assistants  Meribeth Mattes, MD    Procedure Summary  Anesthesia: General  ASA: II  Estimated Blood Loss: 10 mL  Total IV Fluids: 600 mL  Drains: * None in log *    Implants     Type Name Action Serial No.      Internal and External Fixation ANCHOR BIOCOMPOSITE 3X14 W/#2 - SX - [id number] Implanted X     Internal and External Fixation ANCHOR BIOCOMPOSITE 3X14 W/#2 - SX - [id number] Implanted X     Internal and External Fixation ANCHOR SUTURE PUSHLCK 2.9X15.5 - SX - [id number] Implanted X     Anchor ANCHOR FIBERTAK 1.8 KNOTLESS W - [id number] Implanted      Anchor ANCHOR FIBERTAK 1.8 KNOTLESS G - [id number] Implanted      Anchor ANCHOR FIBERTAK 1.8 KNOTLESS G - [id number] Implanted      Anchor ANCHOR FIBERTAK 1.8 KNOTLESS W - [id number] Implanted      Anchor ANCHOR FIBERTAK 1.8 KNOTLESS W - [id number] Implanted      Anchor ANCHOR FIBERTAK 1.8 KNOTLESS W - [id number] Implanted      Anchor ANCHOR FIBERTAK 1.8 KNOTLESS W - [id number] Implanted          Staff:   Circulator: Doyle Askew, RN  Relief Circulator: Earlie Lou  Scrub Person: Geryl Rankins, RN    Indications: Effie Janoski is an 25 y.o. male who dislocated his left shoulder posteriorly after a seizure in April 2021.  He has been seizure-free since then, but he has had multiple subluxation events since then.  He is  attempted physical therapy without relief.  His exam was consistent with posterior instability.  His MRI revealed a posterior labral and SLAP tear as well as a reverse Hill-Sachs lesion.  A CT scan to better define the reverse Hill-Sachs lesion. We discussed risks of the surgery of a SLAP and posterior labral repair with possible McLaughlin procedure.  We discussed risks including, but not limited to stiffness, infection, bleeding, blood clots, damage to nearby structures, instability, fracture, dislocation, medical or anesthetic complications, allergic reactions or emergent surgery, and death.  Consent was freely obtained with expression of a good understanding of the risks, benefits and rehabilitation associated with the procedure.       DESCRIPTION OF PROCEDURE: Rachel  was met in the preoperative holding area.  The left shoulder was marked.  The patient was then brought to the operating room and placed on the operating table in supine position.  After induction of general anesthesia, the shoulder was examined.  There was a 1+ anterior and 3+ posterior load-and-shift.  This was compared to 1+ anterior and posteriorly on the contralateral shoulder side.  They were then slowly and carefully brought into a lateral decubitus position.  An axillary roll  was placed.  All bony prominences were padded.  The body was secured in position with a beanbag.  The upper extremity was then prepped and draped in the usual sterile fashion. A time out was performed to identify the left shoulder as the correct site and side of procedure using multiple identifiers including the signed site mark and consent form  IV Ancef, was given within 1 hour prior to start of procedure.  SCD boots were placed on both legs for mechanical DVT prophylaxis and aspirin will be used postoperatively for chemical DVT prophylaxis.     The bony landmarks of the shoulder were marked out.  An 11 blade was used to create a standard posterior portal.  An  arthroscope was introduced.  Then under spinal needle localization an  8.25 mm cannula was placed through the rotator interval.  A diagnostic arthroscopy of the shoulder was performed.      Diagnostic arthroscopy of the shoulder revealed a tear in the superior and posterior labrum from the 11 o'clock position anteriorly through the 6 o'clock position posteriorly.  There was an unstable superior labrum with an unstable biceps anchor.  The biceps was stable in its groove.  There is no bicipital tenosynovitis.  The rotator cuff including the subscapularis and superior cuff were all intact and stable to probing.  There was a an engaging reverse Hill-Sachs lesion.  There was healthy cartilage on both the humeral head and the glenoid.  There was no relevant glenoid bone loss. There were no loose bodies in the axillary pouch but there was substantial laxity in the IGHL. The IGHL was attached to the humeral side.    Attention was first turned to our SLAP repair.  We used a rasp and rasped the edge of the glenoid from the 11:00 to the 2 o'clock position.   We then placed a one-point millimeters fiber tack anchor at the 11:30 position.  Then using a suture shuttle system and a knotless anchor we secured the labrum without any capsular tensioning at the 11:30 position.  I was happy with our anterior SLAP stability.  Then working interchangeably between our rotator interval and posterior portals, we placed 2 additional posterior anchors through the labral tissue to secure it at the 1:00 and 2:00 positions. We took care to ensure that we did not entrap the biceps itself. Once this was done the shoulder was brought through range of motion and the superior labrum and biceps remained stable.    Attention was then turned to the posterior labral repair.  A ligament elevator was used to elevate tissue and freshen up the glenoid edge from the 6:00 up to the 2 o'clock position.  We then used a rasp to freshen up the edges of the  glenoid.  We placed a 1.8 mm fiber tack anchor at the 5:30 position. Then using a suture passer with a nitinol wire shuttle, we grasped the capsular tissue at the 6 o'clock position and advanced under the labrum at the 5:30 position.  This was used to shuttle a a Fiberwire through this tissue.  This was then secured in place with a knotless locking stitch.  I was happy with this compression. The same series of steps was used with a second 1.8 mm fiber tack anchor placed in the 4:30 position.  We shuttled a stitch in the capsule at the 4 o'clock position and advanced it under the labrum at the 4:30 position where it was secured using the passing stitch.  We  then placed a third 1.8 mm fiber tack anchor at the 3:30 position.  We then plicated the capsule from the 3 o'clock position and secured at the 3:30 position using the passing stitch.  Once this was done I was happy with our capsular tensioning and labral repair.    Attention was then turned to the [employer].  We worked and viewed interchangeably between our posterior and anterior brought interval portals.  We used both a 30 degree and a 70 degree scope. We then used a combination of a rasp and a 4-0 oscillating shaver to abrade the defect down to the fresh bleeding surface.  We then placed two 1.8 mm fiber tack anchors spaced far apart along the defect.  We then used a BirdBeak and grasped our inferior anchor stitch through the midportion of the subscapularis in line withthe defect.  We then used a BirdBeak and grasped our superior anchor stitch for superior portion of the subscapularis in line with the defect. We passed the passing stitch from the superior anchor through the securing stitch at the inferior anchor and seated it within 1 cm.  We passed the passing stitch from the inferior anchor through the securing stitch of the superior anchor and seated it within 1 cm. The securing stitches were then toggled under arthroscopic visualization, and we  watched the capsule and subscapularis rotator cuff compress into the bare defect.  Once this was completed, the defect was fully closed with the rotator cuff.  We then confirmed that the shoulder maintained its full range of motion. I was happy with this Merlinda Frederick procedure.     Portal sites were closed with 3-0 nylon.  We then injected 20 cc of 0.2% plain ropivacaine into the shoulder.  The wounds were then covered by Xeroform, 4 x 4's, ABDs, and foam tape. The arm was placed into abduction sling.  Jeoffrey was awakened from general anesthesia and transferred to recovery room in stable condition.     Postoperative Course: Said will follow my standard SLAP repair protocol.            Chauncey Cruel  Phone Number: [phone]

## 2020-12-16 NOTE — Other (Signed)
 Patient Education   Table of Contents       SLAP Lesions Rehab     To view videos and all your education online visit,   [email or web address]   or scan this QR code with your smartphone.                    SLAP Lesions Rehab     Ask your health care provider which exercises are safe for you. Do exercises exactly as told by your health care provider and adjust them as directed. It is normal to feel mild stretching, pulling, tightness, or discomfort as you do these exercises. Stop right away if you feel sudden pain or your pain gets worse. Do not  begin these exercises until told by your health care provider.   Stretching and range-of-motion exercise   This exercise warms up your muscles and joints and improves the movement and flexibility of your shoulder. This exercise also helps to relieve pain and stiffness.   Passive shoulder horizontal adduction       In passive adduction, you use your other hand to move the injured arm toward your body. The injured arm does not move on its own (passive). In this movement, your arm is moved across your body in the horizontal plane (horizontal adduction).    Sit or stand and pull your left / right elbow across your chest, toward your other shoulder. Stop when you feel a gentle stretch in the back of your shoulder and upper arm.       Keep your arm at shoulder height.       Keep your arm as close to your body as you comfortably can.       Hold for __________ seconds.       Slowly return to the starting position.   Repeat __________ times. Complete this exercise __________ times a day.     Strengthening exercises   These exercises build strength and endurance in your shoulder. Endurance is the ability to use your muscles for a long time, even after they get tired.   Scapular protraction, supine          Lie on your back on a firm surface (supine position). Hold a __________ lb / kg weight in your left / right hand.       Raise your left / right arm straight into the air  so your hand is directly above your shoulder joint.       Push the weight into the air so your shoulder (scapula) lifts off the surface that you are lying on. The scapula will push up or forward (protraction). Do not  move your head, neck, or back.       Hold for __________ seconds.       Slowly return to the starting position. Let your muscles relax completely before you repeat this exercise.   Repeat __________ times. Complete this exercise __________ times a day.   Scapular retraction          Sit in a stable chair without armrests, or stand up.       Secure an exercise band to a stable object in front of you so the band is at shoulder height.       Hold one end of the exercise band in each hand. Your palms should face down.       Squeeze your shoulder blades (scapulae) together and move your elbows slightly behind you (retraction). Do not  shrug  your shoulders.       Hold for __________ seconds.       Slowly return to the starting position.   Repeat __________ times. Complete this exercise __________ times a day.     Shoulder external rotation       Lie down on your uninjured side. Place a soft object, such as a small folded towel, between your left / right arm (your top arm) and your body.       Bend your left / right elbow to a 90-degree angle (right angle). Place your left / right hand palm-down on your abdomen. Squeeze your shoulder blade back.      Keeping your elbow bent to a 90-degree angle, move your left / right forearm away from your abdomen (external rotation).       Your upper arm should not move off the folded towel.       Keep your shoulder blade back.       Hold for __________ seconds.       Slowly return to the starting position.   Repeat __________ times. Complete this exercise __________ times a day.   Shoulder extension, prone          Lie on your abdomen (prone position) on a firm surface so your left / right arm hangs over the edge.       Hold a __________ lb / kg weight in your hand so your  palm faces in toward your body. Your arm should be straight.       Squeeze your shoulder blade down toward the middle of your back.       Slowly raise your arm behind you, up to the height of the surface that you are lying on (extension). Keep your arm straight.       Hold for __________ seconds.       Slowly return to the starting position and relax your muscles.   Repeat __________ times. Complete this exercise __________ times a day.       This information is not intended to replace advice given to you by your health care provider. Make sure you discuss any questions you have with your health care provider.     Document Released: 12/18/2006Document Revised: 04/14/2020Document Reviewed: 05/03/2018     Elsevier Patient Education ? 2022 Elsevier Inc.

## 2020-12-16 NOTE — Other (Signed)
 Patient Education   Table of Contents       General Anesthesia, Adult, Care After     To view videos and all your education online visit,   [email or web address]   or scan this QR code with your smartphone.                    General Anesthesia, Adult, Care After     This sheet gives you information about how to care for yourself after your procedure. Your health care provider may also give you more specific instructions. If you have problems or questions, contact your health care provider.   What can I expect after the procedure?    After the procedure, the following side effects are common:       Pain or discomfort at the IV site.       Nausea.       Vomiting.       Sore throat.       Trouble concentrating.       Feeling cold or chills.       Feeling weak or tired.       Sleepiness and fatigue.       Soreness and body aches. These side effects can affect parts of the body that were not involved in surgery.     Follow these instructions at home:   For the time period you were told by your health care provider:            Rest.      Do not  participate in activities where you could fall or become injured.      Do not  drive or use machinery.      Do not  drink alcohol.      Do not  take sleeping pills or medicines that cause drowsiness.      Do not  make important decisions or sign legal documents.      Do not  take care of children on your own.     Eating and drinking         Follow any instructions from your health care provider about eating or drinking restrictions.       When you feel hungry, start by eating small amounts of foods that are soft and easy to digest (bland), such as toast. Gradually return to your regular diet.       Drink enough fluid to keep your urine pale yellow.       If you vomit, rehydrate by drinking water, juice, or clear broth.     General instructions        If you have sleep apnea, surgery and certain medicines can increase your risk for breathing problems. Follow instructions  from your health care provider about wearing your sleep device:       Anytime you are sleeping, including during daytime naps.       While taking prescription pain medicines, sleeping medicines, or medicines that make you drowsy.       Have a responsible adult stay with you for the time you are told. It is important to have someone help care for you until you are awake and alert.       Return to your normal activities as told by your health care provider. Ask your health care provider what activities are safe for you.       Take over-the-counter and prescription medicines only as told by your health care provider.  If you smoke, do not  smoke without supervision.       Keep all follow-up visits as told by your health care provider. This is important.       Contact a health care provider if:         You have nausea or vomiting that does not get better with medicine.       You cannot eat or drink without vomiting.       You have pain that does not get better with medicine.       You are unable to pass urine.       You develop a skin rash.       You have a fever.       You have redness around your IV site that gets worse.     Get help right away if:         You have difficulty breathing.       You have chest pain.       You have blood in your urine or stool, or you vomit blood.     Summary         After the procedure, it is common to have a sore throat or nausea. It is also common to feel tired.       Have a responsible adult stay with you for the time you are told. It is important to have someone help care for you until you are awake and alert.       When you feel hungry, start by eating small amounts of foods that are soft and easy to digest (bland), such as toast. Gradually return to your regular diet.       Drink enough fluid to keep your urine pale yellow.       Return to your normal activities as told by your health care provider. Ask your health care provider what activities are safe for you.     This  information is not intended to replace advice given to you by your health care provider. Make sure you discuss any questions you have with your health care provider.     Document Released: 03/26/2002Document Revised: 09/02/2021Document Reviewed: 04/20/2019     Elsevier Patient Education ? 2022 Elsevier Inc.

## 2020-12-16 NOTE — Discharge Instructions - Supplementary Instructions (Signed)
 In case of questions about care, call your doctor's office.  If after hours, call [phone] and ask [facility] operator to page your doctor or the covering doctor.  In case of emergency, return to [facility] Emergency Department, or go to your nearest emergency department.    If you have concerns regarding anesthetic care, please have the [facility] operator page [phone].

## 2020-12-16 NOTE — H&P (Signed)
 PPRIMARY CARE PHYSICIAN: Donnita Falls, MD   REFERRING PROVIDER: No ref. provider found, N/A     HPI:   Audwin Semper is a 25 y.o. male presents for left shoulder stabilization.  He reports no changes in his history. Shoulder feels unstable. Denies fevers or chills or any other medical issues.       Past Medical History:   Diagnosis Date   ? Anxiety and depression    ? Epilepsy (CMS/HCC)      Past Surgical History:   Procedure Laterality Date   ? ACHILLES TENDON SURGERY     ? NOSE SURGERY       No family history on file.  Social History     Socioeconomic History   ? Marital status: Unknown     Spouse name: Not on file   ? Number of children: Not on file   ? Years of education: Not on file   ? Highest education level: Not on file   Occupational History   ? Not on file   Tobacco Use   ? Smoking status: Never   ? Smokeless tobacco: Never   Vaping Use   ? Vaping Use: Never used   Substance and Sexual Activity   ? Alcohol use: Never   ? Drug use: Never   ? Sexual activity: Not on file   Other Topics Concern   ? Not on file   Social History Narrative   ? Not on file     Social Determinants of Health     Financial Resource Strain: Not on file   Food Insecurity: Not on file   Transportation Needs: Not on file   Physical Activity: Not on file   Stress: Not on file   Social Connections: Not on file   Intimate Partner Violence: Not on file   Housing Stability: Not on file     ALLERGIES: Bupropion and Latex    MEDICATIONS:   Prior to Admission medications    Medication Sig Start Date End Date Taking? Authorizing Provider   lisdexamfetamine (Vyvanse) 30 mg capsule Take 30 mg by mouth in the morning.    Historical Provider, MD        VITAL SIGNS: There were no vitals taken for this visit.     PHYSICAL EXAM:    On examination Yesenia Fontenette, is a pleasant, well-appearing male, in no apparent distress. Left shoulder skin c/d/i. + posterior apprehension. RRR. Breathing comfortably. Fingers wwp. SILT LUE.      IMAGING:  Radiographs and MRI reviewed.     TREATMENT:  Kharson presents for left shoulder stabilization. We reviewed the plan for surgery.  Answered all questions and patient agrees with plan and wishes to proceed.    Chauncey Cruel, MD

## 2020-12-16 NOTE — Anesthesia Pre-Procedure Evaluation (Signed)
 Patient: Richard Hodges    Procedure Information     Date/Time: 12/16/20 0945    Procedure: L Shoulder Labral Repair? 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)   (+) Vapes nicotine containing substance       Clinical information reviewed:   Tobacco  Allergies  Meds   Med Hx  Surg Hx   Fam Hx  Soc Hx         Physical Exam    Airway  Mallampati: II  TM distance: >3 FB  Mouth opening: >3 FB  Neck ROM: full     Cardiovascular    Dental        Pulmonary    Abdominal    General                Anesthesia Plan    ASA 2     general     Airway: ETT  Monitoring: standard monitors  Postoperative Pain Control: IV/PO analgesics and Peripheral Nerve Block (consent obtained)    Essential imaging and labs available and reviewed    Anesthetic plan and risks discussed with patient.             Additional Equipment Requests

## 2020-12-16 NOTE — Anesthesia Procedure Notes (Signed)
 Peripheral Block    Date/Time: 12/16/2020 12:15 PM    Patient Location:  Post-op  Reason for Block: at surgeon's request, post-op pain management and procedure for pain    Staff:     Anesthesiologist:  Loleta Chance, MD    Resident/CRNA:  Francena Hanly    Performed by:  Resident/CRNA  patient identified, anesthesia consent, monitors and equipment checked, pre-op evaluation and timeout performed    Peripheral Nerve Block:     Patient Position:  Supine    Prep: ChloraPrep      Monitoring:  Cardiac monitor, continuous pulse ox and blood pressure  Common: Interscalene    Upper Extremity: Interscalene    Laterality:  Left  Injection Technique:  Single-shot  Needle:     Needle Gauge:  25 G    Needle Length:  8 cm    Needle Localization:  Anatomical landmarks and ultrasound guidance    Image is saved in permenant record: Yes    Assessment:     Injection Assessment:  Negative aspiration, no paresthesia on injection, incremental injection and local visualized surrounding nerve on ultrasound  Notes:      VSS, no compliactions noted

## 2020-12-16 NOTE — Op Note (Signed)
 Dr Leandro Reasoner at bedside to speak with pt re; proceedure.

## 2020-12-16 NOTE — Op Note (Signed)
 Perioperative Nurse Navigator: Patient's dad verbalized that he and his son were called this AM to come in earlier and in the process he (dad) forgot his wallet.  Dad was concerned about paying for parking. Dad supported and given a parking voucher.  A.Alfreida Steffenhagen

## 2020-12-16 NOTE — Discharge Instructions (Addendum)
 Department of Orthopaedic Surgery  Office of Chauncey Cruel, M.D.    Arthroscopic SLAP Repair Rehab Guideline      Rehabilitation Precautions:   Sling will be placed in OR and worn for 2-6 weeks depending on physician and operative management.    The sling may be removed for dressing, hygiene, and exercises  Sling may also be removed for tabletop activities within pain tolerance such as eating, brushing teeth, writing, and occasional keyboard use.  Biceps Tenodesis = no isolated biceps activation for 8 weeks     (Post-operative -3 Weeks)    ROM  Passive forward elevation to 90? in the scapular plane  Pendulum - gentle (2-3x/day)   Posterior joint mobilizations (grades I-II)  Elbow ROM - may be AROM/PROM - no resistance  ER with elbow by side/wand to tolerance (no >30?)  Full hand/wrist ROM  Strength  Scapular stabilization (scapular clock and manual resisted scapular PNF patterns)  Submaximal isometrics in neutral for shoulder and elbow.- No elbow flexion  Goals to Progress to Next Phase  Control pain and inflammation  Gradual increase in ROM  Promote healing of tissue  Initiate muscle contraction    (3-6 Weeks)  ROM  Full PROM in all planes except ER per patient tolerance by 6 weeks  May use pulleys/supine wand in all planes to patient tolerance  ER-progress PROM, AROM as tolerated (up to 60?)  Gentle posterior capsular stretches and Grade II mobs  Keep all exercise pain-free ROM only    Strength  Initiate prone and side-lying exercise with light resistance  Initiate IR/ER at neutral ( 0? of Abduction) with tubing  Towel roll placed in axillary space  Supine punches with light resistance  UBE for endurance Goals to Progress to Next Phase  Enhance upper extremity strength  Achieve full PROM in all planes except ER by 6 weeks  PROM ER (up to 60o)     (6-12 Weeks)  ROM  Towel and side-lying internal rotation stretch   Continue posterior and initiate inferior Grade III-IV mobilization at [facility]  joint  Strength  Strength and stability progression with increased ROM  Stress high reps/low resistance  Initiate biceps strengthening at 8 weeks with elbow flexed and neutral abduction  Initiate plyotoss chest pass  Initiate PNF patterns with tubing  Initiate IR/ER exercises at 90? of abduction  Initiate supine rhythmic stabilization at 90? flexion    Goals to Progress to Next Phase  Achieve full AROM by 8 weeks except ER  Full PROM External Rotation by 8-10 wks  Full AROM External Rotation by 10-12 wks  5/5 rotator cuff strength  65-70% IR/ER isokinetic testing    (12-16 Weeks)  ROM  Continue with all ROM activities from previous phases if goals were not met  Continue pectoralis and posterior capsular stretch  Strength  Light tossing - Single knee throwing 15 ft with emphasis on proper throwing mechanics and follow through. (Only if ROM has been normalized in all planes)  Initiate single arm plyo toss  Progress eccentric strengthening of posterior cuff and scapular musculature  Begin throwing progression and contact activities at 4 months  Goals to Return to Sport  Full throwing status at 6-8 months with successful completion of throwing program     * Please call [phone] with any questions    References  Darlyn Read, Orthopedic Examination, Evaluation, and Intervention, [place]: [employer]; 2004: 1343- 1354    Eber Hong, [place], [place], Carolanne Grumbling, PC, [place], DM; A biomechanical comparison  of repair techniques for type II SLAP lesion, [facility], April 2004; 32: 350-757          Department of Orthopaedic Surgery  Office of Rhoderick Moody, M.D.    General Post-Operative Instructions  Upper Extremity Procedures    Follow-up:  Date:____________ Time:_____________  X-rays:_________________________________________________________________    Your appointment will be in the [facility] Orthopaedic Department on the 7th floor of the [facility] building located @ [address] (next to  the [employer]).    Main Office #: [phone]    Weight Bearing/Lifting Status:   _____No weight bearing/lifting. Sling at all times except for hygiene for ____ weeks   _____No weightbearing/ lifting. Sling at all times except for hygiene and 3-4x daily elbow,   forearm, wrist and digital range of motion for ____ weeks   _____No lifting > ____lbs   _____Lifting and weight bearing without restriction   _____Wean from sling as tolerated    Wound Care:  Keep dressings on for three to five days after surgery.  You may then remove and apply clean bandages over incision daily  Some bleeding after surgery is normal/expected and do not be alarmed if a small amount soaks through the dressing  Keep dressings and incision(s) clean and dry to avoid infection; if dressing becomes soiled/wet then remove, pat incision dry with clean towel and apply a clean bandage   After initial dressing change you may shower but incision(s) must be covered with some type of protection such as plastic wrap or bag; if incision gets wet then pat dry with clean dry towel  No submersion of incision(s) in standing water such as bathes, hot tubs, pools, etc until at least 3 weeks after surgery and wounds are sealed  DO NOT remove sutures if present  DO NOT remove splints or casts if present (no dressing changes until follow up appt)    Pain Management:  You will receive a prescription for pain medication the day of surgery; take as directed  Pain medications are generally not refilled after the initial prescription  Common side effects are nausea, drowsiness and constipation. Consider taking medication with food and use an over-the-counter laxative as needed  DO NOT operate a motor vehicle or heavy machinery while on narcotic medication  DO NOT take other pain medications or Acetaminophen(Tylenol) unless instructed to do so  DO NOT drink alcohol while on pain medication     Physical Thearpy (PT):  If you were given home exercises to do, begin these  24 hours after surgery  If indicated you will receive a formal PT prescription on the day of surgery  You must bring this PT prescription to your first PT session to receive treatment  If indicated you will be given a Rehabilitation Protocol specific to your surgery, this also should be brought with you to your first PT session  PT usually begins within three to five days after surgery  Stiffness and pain are common after surgery and it is critical to continue your exercises as directed despite this discomfort  If at anytime there are questions regarding or set backs with PT, please contact my office    Other Instructions and Information:  Use ice, elevation and compressive wrap/stocking to aide with swelling  Avoid long periods of sitting, bedrest or travel during the first two weeks after surgery  The effects of anesthesia including drowsiness, fatigue and nausea may take hours to days  to completely resolve  DO NOT  make important decisions within 24 hours of surgery   DO NOT drive until cleared by a physician    Emergency Care:  Contact Dr. Deatra Robinson office [phone]) if you experience any of the following:  Fever over 101 (transient low grade fevers after surgery are common)  Redness, excessive bleeding or persistent oozing/drainage from incisions  Increasing pain, swelling or numbness  Calf pain or swelling, chest pain or shortness of breath  Excessive nausea or vomiting  Other concerns or questions  After hours emergency questions can be answered by calling the [facility] main number @ [phone] and asking for the ?on-call orthopaedic physician?  If you experience a life threatening emergency then proceed immediately to the nearest emergency room    Follow-up Care:  Your follow-up appointment with Dr.Salzler should already be scheduled, as noted above, if there are any questions regarding this follow up or a need to change/cancel the appointment then please call the office at [phone]  You  will need to call and schedule the PT appointment at a qualified location that is convenient to you.  A list of suggested locations around greater [place] can be provided upon request from my office.

## 2020-12-16 NOTE — Anesthesia Procedure Notes (Signed)
 Airway    Date/Time: 12/16/2020 8:52 AM    Location:  OR  Urgency:  Elective  Difficult Airway: No    Induction Technique:  Intravenous  RSI: No    Cricoid pressure: No    Staff:     Anesthesiologist:  Vonzella Nipple, MD    Resident/CRNA:  Winnifred Friar T. Marshall Cork, CRNA    Performed by:  Resident/CRNA  Indications and Patient Condition:     Indications for Airway Management:  Anesthesia    Preoxygenated: Yes      Patient Position:  Sniffing    Mask Ventilation:  Easy mask  Final Airway Details:     Final Airway Type:  Endotracheal airway    Final Endotracheal Airway:  ETT    Cuffed: Yes      Stylet: Yes      Technique Used for Successful ETT Placement:  Direct laryngoscopy    Insertion Site:  Oral    Blade Type:  Miller    Laryngoscope Blade/Video laryngoscope Blade Size:  3    ETT Size (mm):  8.0    Measured from:  Teeth    ETT Depth (cm):  23    Placement Verified by: auscultation and capnometry      Cormack-Lehane Classification:  Grade I - full view of glottis    Number of Attempts at Approach:  1

## 2020-12-16 NOTE — Op Note (Signed)
 Report taken from [facility] RN.  Pt sleeping.  VSS

## 2020-12-16 NOTE — Op Note (Signed)
 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  He sustained a fall at that time and injured his left shoulder.  He also had a seizure after taking Bupropion in 2014

## 2020-12-27 ENCOUNTER — Encounter (HOSPITAL_BASED_OUTPATIENT_CLINIC_OR_DEPARTMENT_OTHER): Admitting: Sports Medicine

## 2020-12-27 ENCOUNTER — Other Ambulatory Visit

## 2020-12-27 ENCOUNTER — Ambulatory Visit
Admit: 2020-12-27 | Discharge: 2020-12-27 | Payer: PRIVATE HEALTH INSURANCE | Attending: Sports Medicine | Primary: Student in an Organized Health Care Education/Training Program

## 2020-12-27 ENCOUNTER — Ambulatory Visit: Payer: PRIVATE HEALTH INSURANCE | Primary: Student in an Organized Health Care Education/Training Program

## 2020-12-27 DIAGNOSIS — M25512 Pain in left shoulder: Secondary | ICD-10-CM

## 2020-12-27 NOTE — Progress Notes (Signed)
 Department of Orthopaedic Surgery    POST OP VISIT: left shoulder arthroscopy with posterior labral repair, SLAP repair, and McLaughlin Procedure DOS 12/16/20     HPI:   Richard Hodges is a 25 y.o. male presents for routine post operative visit, now 1.5 weeks status post above procedure. They are doing well. They have been compliant with the instructions to be NWB left upper extremity in the sling. Denies issues with incision sites. Denies redness or drainage. Denies fevers or chills. Denies numbness or tingling. He is managing his pain with tylenol. Taking aspirin 325mg  daily for DVT prophylaxis.    Past Medical History:   Diagnosis Date   . Anxiety and depression    . Epilepsy (CMS/HCC)      Past Surgical History:   Procedure Laterality Date   . ACHILLES TENDON SURGERY     . NOSE SURGERY       No family history on file.  Social History     Socioeconomic History   . Marital status: Unknown     Spouse name: Not on file   . Number of children: Not on file   . Years of education: Not on file   . Highest education level: Not on file   Occupational History   . Not on file   Tobacco Use   . Smoking status: Never   . Smokeless tobacco: Never   Vaping Use   . Vaping Use: Never used   Substance and Sexual Activity   . Alcohol use: Never   . Drug use: Never   . Sexual activity: Not on file   Other Topics Concern   . Not on file   Social History Narrative   . Not on file     Social Determinants of Health     Financial Resource Strain: Not on file   Food Insecurity: Not on file   Transportation Needs: Not on file   Physical Activity: Not on file   Stress: Not on file   Social Connections: Not on file   Intimate Partner Violence: Not on file   Housing Stability: Not on file     ALLERGIES: Bupropion and Latex    MEDICATIONS:   Prior to Admission medications    Medication Sig Start Date End Date Taking? Authorizing Provider   acetaminophen (Tylenol) 325 mg tablet Take 3 tablets (975 mg) by mouth every 8 (eight) hours for 15  days. 12/16/20 12/31/20 Yes Meribeth Mattes, MD   aspirin 325 mg EC tablet Take 1 tablet (325 mg) by mouth in the morning. 12/16/20 01/15/21 Yes Meribeth Mattes, MD   ibuprofen 600 mg tablet Take 1 tablet (600 mg) by mouth every 6 (six) hours if needed (pain) 12/16/20 12/31/20 Yes Meribeth Mattes, MD   lisdexamfetamine (Vyvanse) 30 mg capsule Take 30 mg by mouth in the morning.   Yes Historical Provider, MD   oxyCODONE (Roxicodone) 5 mg immediate release tablet Take 1 tablet (5 mg) by mouth every 4 hours if needed for pain 12/16/20 12/21/20  Meribeth Mattes, MD        VITAL SIGNS: There were no vitals taken for this visit.     PHYSICAL EXAM:    On examination [name], is a pleasant, well-appearing male, in no apparent distress.  Examination of the left shoulder reveals skin is clean and dry. Sling was removed for our exam. The surgical incision sites are healing well with nylon sutures in place.  No active drainage, no periwound erythema.  No signs  of infection.  There is minimal tenderness.  Range of motion and strengthening exam deferred.  Fires deltoid, biceps, triceps, wrist extensors and finger flexors.  Full elbow range of motion.  Able to make composite fist, OK sign, and scissors.  Sensation is intact to light touch in the A/M/U/R nerve distributions.  Hand is warm and well perfused with palpable radial pulses.    IMAGING:  Radiographs of the left shoulder(s) were obtained and reviewed today. No acute fracture or dislocation.      Diagnosis Plan   1. Left shoulder pain  XR SHOULDER LEFT 2+ VIEWS      2. Instability of left shoulder joint            TREATMENT:  Patient was seen and evaluated with Dr. Leandro Reasoner.  All findings were reviewed with the patient today including intraoperative photos and postoperative x-rays.  Patient is doing well now nearly 2 weeks from surgery.  Incision sites are healing well without signs of infection.  Nylon sutures were removed and Steri-Strips were placed.  They can  get the incisions wet but do not submerge for another 2 weeks.  Instructed to start PT and follow the post op protocol provided. They should wear the sling continuously for 4 weeks post operatively. They should be out of the sling by 6 weeks post op.  Follow-up in 6 weeks for reevaluation.  Answered all questions and patient agrees with plan.    [place], [place]

## 2020-12-31 ENCOUNTER — Telehealth (HOSPITAL_BASED_OUTPATIENT_CLINIC_OR_DEPARTMENT_OTHER): Admitting: Sports Medicine

## 2020-12-31 NOTE — Telephone Encounter (Signed)
 Patient is calling in stating that he has an appointment with PT at 3:15pm today. Patient states he has misplaced his script for PT and is requesting a copy be sent to his [facility]. Patient provided fax number to the PT office as well. Please fax over script also    [facility]  FAX#[phone]

## 2020-12-31 NOTE — Telephone Encounter (Signed)
Faxed and let the patient know.

## 2021-02-07 ENCOUNTER — Ambulatory Visit
Admit: 2021-02-07 | Discharge: 2021-02-07 | Payer: PRIVATE HEALTH INSURANCE | Attending: Sports Medicine | Primary: Student in an Organized Health Care Education/Training Program

## 2021-02-07 ENCOUNTER — Other Ambulatory Visit

## 2021-02-07 ENCOUNTER — Encounter (HOSPITAL_BASED_OUTPATIENT_CLINIC_OR_DEPARTMENT_OTHER): Admitting: Sports Medicine

## 2021-02-07 DIAGNOSIS — S43432S Superior glenoid labrum lesion of left shoulder, sequela: Secondary | ICD-10-CM

## 2021-02-07 NOTE — Progress Notes (Signed)
I personally interviewed and examined the patient and both the resident/PA and I contributed to this electronic note.  I agree with the history, exam, assessment and plan as detailed in this note and edited it as necessary.     Makena Mcgrady, MD

## 2021-02-07 NOTE — Progress Notes (Signed)
Department of Orthopaedic Surgery  PRIMARY CARE PHYSICIAN: Donnita Falls, MD   REFERRING PROVIDER: Donnita Falls, MD, [phone]     Chief Complaint: Post-operative visit: left shoulder arthroscopy with posterior labral repair, SLAP repair, and McLaughlin Procedure (DOS 12/16/20)    HPI:  Richard Hodges is a 26 y.o. male who presents to clinic for routine postoperative visit for above procedure.  He is doing very well.  He has minimal pain at this point.  He has completely weaned out of his sling.  He has been working on range of motion with physical therapy, and feels he has almost complete range of motion back.  He denies any issues with the incision sites.    History:  Past Medical History  Past Medical History:   Diagnosis Date   . Anxiety and depression    . Epilepsy (CMS/HCC)        Past Surgical History  Past Surgical History:   Procedure Laterality Date   . ACHILLES TENDON SURGERY     . NOSE SURGERY         Medications  Current Outpatient Medications   Medication Instructions   . lisdexamfetamine (VYVANSE) 30 mg, oral, Every morning       Allergies  Allergies   Allergen Reactions   . Bupropion      Other reaction(s): Seizures  Other reaction(s): Seizures  Other reaction(s): Seizures  Other reaction(s): Seizures     . Latex Rash       Social History  Social History     Socioeconomic History   . Marital status: Unknown     Spouse name: Not on file   . Number of children: Not on file   . Years of education: Not on file   . Highest education level: Not on file   Occupational History   . Not on file   Tobacco Use   . Smoking status: Never   . Smokeless tobacco: Never   Vaping Use   . Vaping Use: Never used   Substance and Sexual Activity   . Alcohol use: Never   . Drug use: Never   . Sexual activity: Not on file   Other Topics Concern   . Not on file   Social History Narrative   . Not on file     Social Determinants of Health     Financial Resource Strain: Not on file   Food Insecurity: Not on file    Transportation Needs: Not on file   Physical Activity: Not on file   Stress: Not on file   Social Connections: Not on file   Intimate Partner Violence: Not on file   Housing Stability: Not on file       Review of systems: per HPI, otherwise non-contributory.    Physical Exam:  On examination Matthewe is a pleasant, well-appearing M in no apparent distress.     Examination of the left shoulder reveals skin is clean and intact. Incisions are well healed with no surrounding erythema.   AROM: Forward elevation 165, ER 70, IR to mid lumbar spine.   Sensation intact in median, ulnar, radial, and axillary nerve distributions.   Able to move all fingers and both thumbs without issue.  Hands are warm and well perfused with palpable radial pulses.    Assessment/Plan:  Patient was seen and evaluated with Dr. Leandro Reasoner.  He is doing very well status post the above procedure.  Incision sites are well-healed.  He has been compliant with physical therapy  and should continue to follow the postop protocol provided.  His focus at this point should be continued improvement of range of motion and stretching as well as muscle activation start (band work, resistance activities).  He will not begin strengthening until the 12-week mark.  He should continue to avoid any contact sports, skiing, or snowboarding.  We will see him in follow-up in another 6 weeks to ensure that he continues to progress.    All questions were answered the their satifaction and they agree with the plan. Patient understands to call the office with any new questions or concerns.       Kerry Hough, PGY2

## 2021-04-01 ENCOUNTER — Other Ambulatory Visit

## 2021-04-01 ENCOUNTER — Ambulatory Visit
Admit: 2021-04-01 | Discharge: 2021-04-01 | Payer: PRIVATE HEALTH INSURANCE | Attending: Sports Medicine | Primary: Student in an Organized Health Care Education/Training Program

## 2021-04-01 DIAGNOSIS — S43432S Superior glenoid labrum lesion of left shoulder, sequela: Secondary | ICD-10-CM

## 2021-04-01 NOTE — Progress Notes (Signed)
Department of Orthopaedic Surgery  PRIMARY CARE PHYSICIAN: Donnita Falls, MD   REFERRING PROVIDER: No ref. provider found, N/A     Chief Complaint: Post-operative visit: left shoulder arthroscopy with posterior labral repair, SLAP repair, and McLaughlin Procedure (DOS 12/16/20)    HPI:  Richard Hodges is a 26 y.o. male who presents to clinic for routine postoperative visit for above procedure. He is now just shy of 4 months out and doing well. Continuing to progress with his strengthening with PT. No numbness/tingling. He denies any issues with the incision sites. No new concerns. He says his shoulder feels very stable. He does mention some soreness along the triceps after PT, followed by some bruising at the triceps region.    History:  Past Medical History  Past Medical History:   Diagnosis Date   . Anxiety and depression    . Epilepsy (CMS/HCC)        Past Surgical History  Past Surgical History:   Procedure Laterality Date   . ACHILLES TENDON SURGERY     . NOSE SURGERY         Medications  Current Outpatient Medications   Medication Instructions   . lisdexamfetamine (VYVANSE) 30 mg, oral, Every morning       Allergies  Allergies   Allergen Reactions   . Bupropion      Other reaction(s): Seizures  Other reaction(s): Seizures  Other reaction(s): Seizures  Other reaction(s): Seizures     . Latex Rash       Social History  Social History     Socioeconomic History   . Marital status: Unknown     Spouse name: Not on file   . Number of children: Not on file   . Years of education: Not on file   . Highest education level: Not on file   Occupational History   . Not on file   Tobacco Use   . Smoking status: Never   . Smokeless tobacco: Never   Vaping Use   . Vaping Use: Never used   Substance and Sexual Activity   . Alcohol use: Never   . Drug use: Never   . Sexual activity: Not on file   Other Topics Concern   . Not on file   Social History Narrative   . Not on file     Social Determinants of Health     Financial  Resource Strain: Not on file   Food Insecurity: Not on file   Transportation Needs: Not on file   Physical Activity: Not on file   Stress: Not on file   Social Connections: Not on file   Intimate Partner Violence: Not on file   Housing Stability: Not on file       Review of systems: per HPI, otherwise non-contributory.    Physical Exam:  On examination Richard Hodges is a pleasant, well-appearing male in no apparent distress.   Examination of the left shoulder reveals skin is clean and intact. Incisions are well healed with no surrounding erythema.   AROM: Forward elevation 180, ER 90, IR to mid-thoracic (symmetric to the contralateral side, pain-free)  Strength 5/5 FE, ER, subscap  Sensation intact in median, ulnar, radial, and axillary nerve distributions.   Hands are warm and well perfused.    IMAGING: No new imaging today.    Assessment/Plan:  Patient was seen and evaluated with Dr. Leandro Reasoner today. Patient is now just shy of 4 months out from the above procedure. Overall he is doing very  well and continues to make progress with PT per protocol. Provided with a new PT Rx today. Also added recommendation for graston therapy on his triceps. No evidence of infection. No formal restrictions at this point. We did advise him to avoid exercises such as heavy bench pressing, and stressed the importance that if he does do this type of lifting, he should do lower weights with higher reps, and he should focus on his mechanics. In regards to his triceps tenderness, we are hopeful this will continue to improve over time. Patient may call regarding his triceps pain, at which time he should see Dr. Earlene Plater in [facility]R for consideration of injections vs noninvasive procedures. All questions were answered the their satifaction and they agree with the plan. Patient understands to call the office with any new questions or concerns.     Follow up: prn, f/u with PMR Earlene Plater if calls about triceps    Richard Hodges, [place]

## 2021-05-02 LAB — BMP (EXT)
Anion Gap (EXT): 9 (ref 7–16)
BUN (EXT): 14 mg/dL (ref 7–25)
CO2 (EXT): 26 MMOL/L (ref 19–28)
CalciumCalcium (EXT): 9.8 mg/dL (ref 8–10.5)
Chloride (EXT): 104 MMOL/L (ref 98–110)
Creatinine (EXT): 1.13 mg/dL (ref 0.7–1.3)
Glucose (EXT): 83 mg/dL (ref 70–100)
Potassium (EXT): 3.8 MMOL/L (ref 3.1–5.3)
Sodium (EXT): 139 MMOL/L (ref 135–145)
eGFR - Creat CKD-EPI (EXT): >90 mL/min/{1.73_m2} (ref >59)

## 2021-12-17 NOTE — Telephone Encounter (Signed)
Patient requesting call back from:  [place]    In regards to:  Returning your call      Call back number is: [phone]

## 2021-12-17 NOTE — Telephone Encounter (Signed)
Richard Hodges but no answer. LVM to call us back.   He was last seen in March and recommended to follow up as needed and possibly with PMR depending on his symptoms. Usually we have patients come in for an exam first which will help [place] decide if we need an updated MRI.   He is 1 year s/p left shoulder arthroscopy with posterior labral repair, SLAP repair, and McLaughlin Procedure (DOS 12/16/20)

## 2021-12-17 NOTE — Telephone Encounter (Signed)
Patient called in to schedule a follow up appointment for his left shoulder that he had surgery on. Patient is scheduled for this Friday 12/19/2021 and he stated that if the provider wants him to have an MRI if it can be scheduled for this Friday so he does not have to make 2 trips.        Best call back number; [phone]

## 2021-12-17 NOTE — Telephone Encounter (Signed)
Hi Courtney,    Just wanted to confirm. They should be seeing Salzler or PMR?

## 2021-12-18 NOTE — Telephone Encounter (Signed)
Spoke with the patient and let him know we would need to see him first before ordering MRI.

## 2021-12-18 NOTE — Telephone Encounter (Signed)
Pt returned call [place] [facility]. States he is coming in for left shoulder. Would like a call back in regards to MRI. Thank you.

## 2021-12-18 NOTE — Telephone Encounter (Signed)
LVM for the patient to determine reason for tomorrows visit.

## 2021-12-18 NOTE — Telephone Encounter (Signed)
Left another vm for the patient.

## 2021-12-19 ENCOUNTER — Ambulatory Visit
Admit: 2021-12-19 | Discharge: 2021-12-19 | Payer: PRIVATE HEALTH INSURANCE | Attending: Sports Medicine | Primary: Student in an Organized Health Care Education/Training Program

## 2021-12-19 ENCOUNTER — Inpatient Hospital Stay
Admit: 2021-12-19 | Payer: PRIVATE HEALTH INSURANCE | Primary: Student in an Organized Health Care Education/Training Program

## 2021-12-19 DIAGNOSIS — S43432S Superior glenoid labrum lesion of left shoulder, sequela: Secondary | ICD-10-CM

## 2021-12-19 DIAGNOSIS — M25512 Pain in left shoulder: Secondary | ICD-10-CM

## 2021-12-19 NOTE — Progress Notes (Signed)
DEPARTMENT OF ORTHOPAEDIC SURGERY    REASON FOR VISIT: S/p left shoulder arthroscopy with posterior labral repair, SLAP repair, and McLaughlin Procedure (DOS 12/16/20)    SUBJECTIVE  Richard Hodges is a 26 y.o. male who presents today for evaluation of new concern approximately 1 year after the above procedure.  He reports that he had been doing well until around 1 month ago, at which time he woke up and felt sore in his shoulder and was concerned that it had subluxed overnight.  His pain subsided shortly after this and he again was doing well until this past Monday, 12/15/21.  He reports that he was wrestling with his brother when he feels his shoulder dislocated and self-relocated.  He has been having significant pain diffusely in his shoulder since then.  He does endorse feelings of instability.  Denies any numbness or tingling in his arm.    No other concerns.    Medical History:  Past Medical History:   Diagnosis Date   . Anxiety and depression    . Epilepsy        Surgical History:  Past Surgical History:   Procedure Laterality Date   . ACHILLES TENDON SURGERY     . NOSE SURGERY         Current Medications:  Current Outpatient Medications   Medication Sig Dispense Refill   . lisdexamfetamine (Vyvanse) 30 mg capsule Take 30 mg by mouth in the morning.       No current facility-administered medications for this visit.       Allergies:  Allergies   Allergen Reactions   . Bupropion      Other reaction(s): Seizures  Other reaction(s): Seizures  Other reaction(s): Seizures  Other reaction(s): Seizures     . Latex Rash       Social History:  Social History     Socioeconomic History   . Marital status: Unknown     Spouse name: Not on file   . Number of children: Not on file   . Years of education: Not on file   . Highest education level: Not on file   Occupational History   . Not on file   Tobacco Use   . Smoking status: Never   . Smokeless tobacco: Never   Vaping Use   . Vaping Use: Never used   Substance and Sexual  Activity   . Alcohol use: Never   . Drug use: Never   . Sexual activity: Not on file   Other Topics Concern   . Not on file   Social History Narrative   . Not on file     Social Determinants of Health     Financial Resource Strain: Not on file   Food Insecurity: Not on file   Transportation Needs: Not on file   Physical Activity: Not on file   Stress: Not on file   Social Connections: Not on file   Intimate Partner Violence: Not on file   Housing Stability: Not on file     Tobacco Use: Low Risk  (02/07/2021)    Patient History    . Smoking Tobacco Use: Never    . Smokeless Tobacco Use: Never    . Passive Exposure: Not on file     Alcohol Use: Not on file     Social History     Substance and Sexual Activity   Drug Use Never       Family History:  No family history on file.  Review of Systems:  10 point review of systems reviewed and noncontributory besides what is included in HPI     PHYSICAL EXAM  Well-appearing male in no apparent distress. Examination of the left shoulder reveals skin is clean and intact.  Surgical incisions are well healed without evidence of infection.  There is no erythema, edema, ecchymosis, or bony deformity. There is mild tenderness diffusely over the bicepital groove, AC joint, lateral cuff or posterior joint line. AROM: forward elevation 180, ER 60, IR lumbar spine. Supine PROM FE 180, ER 70, IR 80.  Posterior apprehension with load-and-shift of left shoulder.  There is guarding with load-and-shift of right shoulder.  No crepitus. Hands are warm and well perfused with palpable radial pulses and neurologic sensation intact to light touch in A/M/U/R nerve distributions.    IMAGING: XR left shoulder taken today and reviewed by US demonstrates Tamera Reason lesion, unclear if it is unchanged from prior imaging, otherwise no acute bony abnormalities     Diagnosis Plan   1. Labral tear of shoulder, left, sequela        2. Acute pain of left shoulder  MSK INTERVENTION UPPER (ABOVE WAIST)    MR  SHOULDER LEFT ARTHROGRAM    XR SHOULDER LEFT 2+ VIEWS      3. Instability of left shoulder joint            TREATMENT  Patient was seen and evaluated with Dr. Leandro Reasoner today. Richard Hodges is a 26 y.o. male who presents today for left shoulder pain after dislocation.  All findings were reviewed with the patient today.  Given his exam and his recent possible dislocation, we strongly recommend obtaining an MR arthrogram of his left shoulder to evaluate for injury.  This advanced imaging will directly guide our treatment options, which may include surgery.  We discussed multiple different nonoperative and operative treatment options, and ultimately MR arthrogram will inform the decision.  Unfortunately, surgical intervention at this point would likely be a more complicated procedure involving use of cadaver bone graft.  We reviewed that recovery reviewed with the overall similar to his last surgery, though slightly more painful after surgery with more stiffness.  We will see him back after MR arthrogram is completed to discuss this further.    The nature and progression of the condition was discussed. All questions answered and they agree to the plan.     Follow up: After MR arthrogram    Eulogio Ditch, PA-C

## 2022-01-06 ENCOUNTER — Inpatient Hospital Stay
Admit: 2022-01-06 | Payer: PRIVATE HEALTH INSURANCE | Primary: Student in an Organized Health Care Education/Training Program

## 2022-01-06 DIAGNOSIS — S43005A Unspecified dislocation of left shoulder joint, initial encounter: Secondary | ICD-10-CM

## 2022-01-06 DIAGNOSIS — M25512 Pain in left shoulder: Secondary | ICD-10-CM

## 2022-01-06 MED ORDER — iopamidol (Isovue-300) 300 mg iodine /mL (61 %) injection 10 mL
300 | Freq: Once | INTRAVENOUS | Status: AC
Start: 2022-01-06 — End: 2022-01-06
  Administered 2022-01-06: 19:00:00 10 mL

## 2022-01-06 MED ORDER — gadoterate meglumine (Dotarem/Clariscan) 0.5 mmol/mL injection 0.1 mL
0.5 | Freq: Once | INTRAVENOUS | Status: AC
Start: 2022-01-06 — End: 2022-01-06
  Administered 2022-01-06: 19:00:00 0.1 mL via INTRA_ARTICULAR

## 2022-01-06 MED ORDER — lidocaine (Xylocaine) 10 mg/mL (1 %) injection 50 mg
10 | Freq: Once | INTRAMUSCULAR | Status: DC
Start: 2022-01-06 — End: 2022-01-07

## 2022-01-06 MED ORDER — LORazepam (Ativan) 0.5 mg tablet
0.5 | ORAL_TABLET | ORAL | 0 refills | 15.00000 days | Status: AC
Start: 2022-01-06 — End: 2022-01-07
  Filled 2022-01-06: qty 2, 1d supply, fill #0

## 2022-01-06 NOTE — Telephone Encounter (Signed)
Called and spoke with the patient.  He is scheduled for an MRI this afternoon.  He states he is claustrophobic and is requesting medication prior to his MRI.  I discussed sending a prescription for Ativan 0.5 Mg for him to take 30 minutes to 1 hour prior to his procedure.  It was then 2 tablets and he can take a second if needed.  [facility]  He states he is already at [facility] for the MRI and will go [facility] the [facility] the [facility] if he has time. All questions were answered and patient in agreement with the plan.

## 2022-01-06 NOTE — Telephone Encounter (Signed)
Patient is calling asking for medication for scan scheduled for this afternoon.    Patient suffers from anxiety when in enclosed places.    Please call [facility] when the prescription has been sent to pharmacy on file.    Callback is [phone]

## 2022-01-06 NOTE — Telephone Encounter (Signed)
Will send to [facility]

## 2022-01-06 NOTE — Telephone Encounter (Signed)
Pt states he just missed a call from office that maybe related to his medication for MRI, he is requesting a call back. Thank you.

## 2022-01-21 NOTE — Telephone Encounter (Addendum)
LVM for the patient letting him know I was calling to schedule MRI FU. Patient was already scheduled for 01/12. Asked the patient to call our office if he wants to move up appointment.

## 2022-01-21 NOTE — Telephone Encounter (Signed)
Patient requesting call back from:  [facility]   In regards to: did not disclose, I scheduled a FU appt per your message to patient    Call back number is: [phone]

## 2022-01-30 ENCOUNTER — Ambulatory Visit
Admit: 2022-01-30 | Discharge: 2022-01-30 | Payer: PRIVATE HEALTH INSURANCE | Attending: Sports Medicine | Primary: Student in an Organized Health Care Education/Training Program

## 2022-01-30 DIAGNOSIS — S43432S Superior glenoid labrum lesion of left shoulder, sequela: Secondary | ICD-10-CM

## 2022-01-30 DIAGNOSIS — S43432A Superior glenoid labrum lesion of left shoulder, initial encounter: Secondary | ICD-10-CM

## 2022-01-30 NOTE — Progress Notes (Signed)
DEPARTMENT OF ORTHOPAEDIC SURGERY    REASON FOR VISIT:   1.  Left shoulder pain  -S/p left shoulder arthroscopy with posterior labral repair, SLAP repair, and McLaughlin Procedure (DOS 12/16/20)    SUBJECTIVE  Richard Hodges is a 27 y.o. male who presents today for MRI arthrogram follow-up for left shoulder pain status post the above procedure.  Patient reports that he was concerned that he may have subluxed his shoulder in his sleep, and on 12/15/2021 while wrestling with his brother he felt as though his shoulder dislocated and self relocated.  Patient reports that since last visit he has had no feelings of left shoulder pain or instability.  Reports that the shoulder feels "normal".  He denies numbness or tingling.  He reports that he has not been doing any significant exercise pending results of the arthrogram.    No other concerns.    Medical History:  Past Medical History:   Diagnosis Date   . Anxiety and depression    . Epilepsy (CMS-HCC) (HHS-HCC)      Patient Active Problem List   Diagnosis   . Attention deficit hyperactivity disorder (ADHD), combined type   . Benzodiazepine misuse   . Vapes nicotine containing substance   . Seizure (CMS-HCC) (HHS-HCC)   . 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 Instability of left shoulder joint   . Acute pain of left shoulder       Surgical History:  Past Surgical History:   Procedure Laterality Date   . ACHILLES TENDON SURGERY     . NOSE SURGERY         Current Medications:  Current Outpatient Medications   Medication Sig Dispense Refill   . lisdexamfetamine (Vyvanse) 30 mg capsule Take 30 mg by mouth in the morning.     Marland Kitchen LORazepam (Ativan) 0.5 mg tablet Take 1 tablet (0.5 mg) by mouth See administration instructions for 1 day. 1 by mouth 1 hour prior to MRI, may repeat X 1 dose 2 tablet 0     No current facility-administered medications for this visit.       Allergies:  Allergies   Allergen Reactions   . Bupropion      Other reaction(s):  Seizures  Other reaction(s): Seizures  Other reaction(s): Seizures  Other reaction(s): Seizures     . Latex Rash       Social History:  Social History     Socioeconomic History   . Marital status: Unknown     Spouse name: Not on file   . Number of children: Not on file   . Years of education: Not on file   . Highest education level: Not on file   Occupational History   . Not on file   Tobacco Use   . Smoking status: Never   . Smokeless tobacco: Never   Vaping Use   . Vaping Use: Never used   Substance and Sexual Activity   . Alcohol use: Never   . Drug use: Never   . Sexual activity: Not on file   Other Topics Concern   . Not on file   Social History Narrative   . Not on file     Social Determinants of Health     Financial Resource Strain: Not on file   Food Insecurity: Not on file   Transportation Needs: Not on file   Physical Activity: Not on file   Stress: Not on file   Social Connections: Not on file  Intimate Partner Violence: Not on file   Housing Stability: Not on file     Tobacco Use: Low Risk  (01/30/2022)    Patient History    . Smoking Tobacco Use: Never    . Smokeless Tobacco Use: Never    . Passive Exposure: Not on file     Alcohol Use: Not on file     Social History     Substance and Sexual Activity   Drug Use Never       Family History:  No family history on file.    Review of Systems:    PHYSICAL EXAM  Richard Hodges is a 27 y.o. male in NAD.  Alert and oriented.    On examination of the left shoulder and upper extremity  Skin: Clean, dry, intact.  No erythema or ecchymosis.  Surgical sites appear well-healed with no acute signs of infection.  RUE AROM: Forward elevation 180, external rotation 90, internal rotation to thoracic spine  LUE AROM: Forward elevation 180, external rotation 90, internal rotation to thoracic spine  No apprehension with active motion  Palpation: No tenderness to palpation of the SC joint, clavicle, or AC joint.  No tenderness to palpation of the bicipital groove, lateral  cuff, or posterior joint line.  Strength: 5/5 rotator cuff strength  Fires deltoid, biceps, triceps, wrist extensors, wrist flexors, EPL, FPL, interossei, grip.   Sensation to light touch is intact in A/M/U/R distributions.   Upper extremity is warm and well-perfused.    IMAGING:   Left shoulder MRI arthrogram 01/06/2022 reviewed by US demonstrates History of prior dislocations and labral repair. 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 No evidence of acute fracture. Minimal tearing of the superior labrum status post repair. Inferior labral fraying.    TREATMENT  Patient was seen and evaluated with Dr.  Leandro Reasoner today. Richard Hodges is a 27 y.o. male who presents today for MR arthrogram follow-up for left shoulder pain status post the above procedure.    We reviewed MRI arthrogram in detail with the patient today.  We discussed that on MRI there is no evidence of an acute dislocation.  The patient does report that his shoulder feels essentially back to his baseline at this time.  We discussed multiple management options and patient would like to hold on surgical intervention at this time which we do feel is reasonable.  We discussed physical therapy though patient would prefer home exercise program which she is instructed to continue with.  He is instructed to follow-up as needed.    The nature and progression of the condition was discussed. All questions answered and they agree to the plan.     Follow up: As needed    Gareth Morgan, PA     This dictation was created through voice recognition. This dictation is proofread, however, grammatical errors and voice recognition errors could still be present. Please contact me about any errors.
